# Patient Record
Sex: Female | Born: 1995
Health system: Southern US, Community
[De-identification: ages and names within clinical notes are randomized; demographics above are authoritative.]

## PROBLEM LIST (undated history)

## (undated) DIAGNOSIS — F909 Attention-deficit hyperactivity disorder, unspecified type: Secondary | ICD-10-CM

## (undated) DIAGNOSIS — T7840XA Allergy, unspecified, initial encounter: Secondary | ICD-10-CM

## (undated) DIAGNOSIS — F419 Anxiety disorder, unspecified: Secondary | ICD-10-CM

## (undated) DIAGNOSIS — F329 Major depressive disorder, single episode, unspecified: Secondary | ICD-10-CM

## (undated) DIAGNOSIS — F32A Depression, unspecified: Secondary | ICD-10-CM

## (undated) DIAGNOSIS — E669 Obesity, unspecified: Secondary | ICD-10-CM

## (undated) HISTORY — DX: Major depressive disorder, single episode, unspecified: F32.9

## (undated) HISTORY — DX: Depression, unspecified: F32.A

## (undated) HISTORY — PX: ANKLE SURGERY: SHX546

---
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
## 2016-02-02 ENCOUNTER — Emergency Department (HOSPITAL_COMMUNITY): Payer: Managed Care, Other (non HMO)

## 2016-02-02 ENCOUNTER — Emergency Department (HOSPITAL_COMMUNITY)
Admission: EM | Admit: 2016-02-02 | Discharge: 2016-02-02 | Disposition: A | Discharge status: Home / Self Care | Payer: Managed Care, Other (non HMO) | Attending: Emergency Medicine | Admitting: Emergency Medicine

## 2016-02-02 ENCOUNTER — Encounter (HOSPITAL_COMMUNITY): Payer: Self-pay | Admitting: Emergency Medicine

## 2016-02-02 DIAGNOSIS — Z79899 Other long term (current) drug therapy: Secondary | ICD-10-CM | POA: Diagnosis not present

## 2016-02-02 DIAGNOSIS — S82891A Other fracture of right lower leg, initial encounter for closed fracture: Secondary | ICD-10-CM

## 2016-02-02 DIAGNOSIS — W109XXA Fall (on) (from) unspecified stairs and steps, initial encounter: Secondary | ICD-10-CM | POA: Insufficient documentation

## 2016-02-02 DIAGNOSIS — S82831A Other fracture of upper and lower end of right fibula, initial encounter for closed fracture: Secondary | ICD-10-CM | POA: Insufficient documentation

## 2016-02-02 DIAGNOSIS — Y999 Unspecified external cause status: Secondary | ICD-10-CM | POA: Insufficient documentation

## 2016-02-02 DIAGNOSIS — S8262XA Displaced fracture of lateral malleolus of left fibula, initial encounter for closed fracture: Secondary | ICD-10-CM | POA: Diagnosis not present

## 2016-02-02 DIAGNOSIS — Y9389 Activity, other specified: Secondary | ICD-10-CM | POA: Diagnosis not present

## 2016-02-02 DIAGNOSIS — S8251XA Displaced fracture of medial malleolus of right tibia, initial encounter for closed fracture: Secondary | ICD-10-CM | POA: Diagnosis not present

## 2016-02-02 DIAGNOSIS — S99911A Unspecified injury of right ankle, initial encounter: Secondary | ICD-10-CM | POA: Diagnosis present

## 2016-02-02 DIAGNOSIS — Y9289 Other specified places as the place of occurrence of the external cause: Secondary | ICD-10-CM | POA: Insufficient documentation

## 2016-02-02 LAB — POC URINE PREG, ED: PREG TEST UR: NEGATIVE

## 2016-02-02 MED ORDER — OXYCODONE-ACETAMINOPHEN 5-325 MG PO TABS
2.0000 | ORAL_TABLET | Freq: Once | ORAL | Status: AC
  Administered 2016-02-02: 2 via ORAL
  Filled 2016-02-02: qty 2

## 2016-02-02 MED ORDER — OXYCODONE-ACETAMINOPHEN 5-325 MG PO TABS: 1.0000 | ORAL_TABLET | ORAL | Status: DC | PRN

## 2016-02-02 NOTE — ED Notes (Signed)
Pt. missed her step and injured her right ankle while going down the stairs this evening , presents with right ankle swelling/ pain .

## 2016-02-02 NOTE — ED Provider Notes (Addendum)
CSN: [id number]     Arrival date & time 02/02/16  0124 History  By signing my name below, I, Catherine Livingston, attest that this documentation has been prepared under the direction and in the presence of Laurence Spatesachel Morgan Little, MD. Electronically Signed: Bethel BornBritney Livingston, ED Scribe. 02/02/2016. 3:53 AM   Chief Complaint  Patient presents with  . Ankle Pain   The history is provided by the patient. No language interpreter was used.   Catherine Livingston is a 20 y.o. female who presents to the Emergency Department complaining of new, constant, 8/10 in severity, right ankle swelling and pain with onset last evening after she missed a step and rolled the ankle. Pt denies other injury including head injury or LOC. She has not injured the ankle in the past. Normal sensation R foot. Pt is otherwise healthy and does not take any regular pain medication. NKDA.   Past Medical History  Diagnosis Date  . ADHD (attention deficit hyperactivity disorder)     took meds for ADD in 7th-9th grade, stopped due to decrease appetitie  . Allergy   . Anxiety   . Obesity   . Depression    History reviewed. No pertinent past surgical history. Family History  Problem Relation Age of Onset  . Depression Mother   . Depression Father   . Lung cancer Maternal Grandmother   . Depression Maternal Grandfather   . Prostate cancer Paternal Grandfather   . Stroke Paternal Grandfather    Social History  Substance Use Topics  . Smoking status: Never Smoker   . Smokeless tobacco: Never Used  . Alcohol Use: 0.0 oz/week    0 Standard drinks or equivalent per week     Comment: occasional   OB History    No data available     Review of Systems  10 Systems reviewed and all are negative for acute change except as noted in the HPI.  Allergies  Other  Home Medications   Prior to Admission medications   Medication Sig Start Date End Date Taking? Authorizing Provider  FLUoxetine (PROZAC) 20 MG tablet Take 1 tablet (20  mg total) by mouth daily. Patient not taking: Reported on 02/02/2016 09/21/15   Lorre Munroeegina W Baity, NP  oxyCODONE-acetaminophen (PERCOCET) 5-325 MG tablet Take 1-2 tablets by mouth every 4 (four) hours as needed for severe pain. 02/02/16   Ambrose Finlandachel Morgan Little, MD   BP 141/88 mmHg  Pulse 72  Temp(Src) 98.3 F (36.8 C) (Oral)  Resp 16  Ht 5' 7.5" (1.715 m)  Wt 210 lb (95.255 kg)  BMI 32.39 kg/m2  SpO2 99%  LMP 01/19/2016 (Approximate) Physical Exam  Constitutional: She is oriented to person, place, and time. She appears well-developed and well-nourished. No distress.  Tearful but NAD  HENT:  Head: Normocephalic and atraumatic.  Eyes: Conjunctivae are normal.  Cardiovascular: Normal rate, regular rhythm, normal heart sounds and intact distal pulses.   Pulmonary/Chest: Effort normal and breath sounds normal. No respiratory distress.  Musculoskeletal: She exhibits edema and tenderness.  Right Achilles tendon intact, no proximal fibular tenderness, no base of 5th metatarsal tenderness, no midfoot instability; tenderness and swelling of medial and lateral right malleoli w/ surrounding ecchymoses; no knee tenderness  Neurological: She is alert and oriented to person, place, and time.  Normal sensation b/l lower extremities  Skin: Skin is warm and dry.  Ecchymoses medial and lateral right ankle  Psychiatric: Judgment normal.  anxious  Nursing note and vitals reviewed.   ED Course  Procedures (including critical care time) DIAGNOSTIC STUDIES: Oxygen Saturation is 99% on RA,  normal by my interpretation.    COORDINATION OF CARE:  3:09 AM-Consult complete with Dr. Sherlean Foot (Orthopedics). Patient case explained and discussed.  3:39 AM Discussed treatment plan which includes right ankle XR, splinting, and pain medication with pt at bedside and pt agreed to plan.   Labs Review Labs Reviewed  POC URINE PREG, ED    Imaging Review Dg Ankle Complete Right  02/02/2016  CLINICAL DATA:  Twisted  ankle on steps. Fall with pain. Initial encounter. EXAM: RIGHT ANKLE - COMPLETE 3+ VIEW COMPARISON:  None. FINDINGS: Distal fibular diaphysis fracture with butterfly fragment. The fracture is displaced ~50% laterally. Transverse medial malleolus fracture which is distracted and laterally displaced. Posterior malleolus fracture is likely. No measurable articular surface involvement. Lateral foot subluxation. Ankle joint effusion Chronic fragmentation at the tip of the lateral malleolus. Smooth talar dome. IMPRESSION: Displaced distal fibular and medial malleolus fractures with lateral foot subluxation. Small posterior malleolus fracture. Electronically Signed   By: Marnee Spring M.D.   On: 02/02/2016 02:20   I have personally reviewed and evaluated these images as part of my medical decision-making.   EKG Interpretation None     Medications  oxyCODONE-acetaminophen (PERCOCET/ROXICET) 5-325 MG per tablet 2 tablet (2 tablets Oral Given 02/02/16 0314)    MDM   Final diagnoses:  Closed right ankle fracture, initial encounter   Patient with right ankle pain after falling down stairs onto her ankle. She was neurovascularly intact, significant swelling, tenderness, and ecchymoses of right medial and lateral malleoli. X-rays show displaced distal fibular and medial malleolus fractures with lateral foot subluxation, small posterior malleolus fracture. I discussed the patient's injury with orthopedics, Dr. Sherlean Foot, who recommended splint, NWB w/ crutches, pain control, and f/u in his clinic today for discussion of surgical management. I reviewed this plan with the patient who is in agreement. Gave the patient Percocet for pain and provided with prescription. She voiced understanding of return precautions and was discharged in satisfactory condition.   I personally performed the services described in this documentation, which was scribed in my presence. The recorded information has been reviewed and is  accurate.    Laurence Spates, MD 02/02/16 0401  Laurence Spates, MD 02/02/16 [phone]

## 2016-05-25 ENCOUNTER — Other Ambulatory Visit: Payer: Self-pay

## 2016-05-29 ENCOUNTER — Other Ambulatory Visit: Payer: Self-pay

## 2017-02-05 ENCOUNTER — Telehealth: Payer: Self-pay | Admitting: Internal Medicine

## 2017-02-05 NOTE — Telephone Encounter (Signed)
[facility] - Client TELEPHONE ADVICE RECORD [facility] Patient Name: Catherine OtterMARY Eversley DOB: [birth year] Initial Comment daughter abdominal cramps, blood in stool Nurse Assessment Nurse: Lane HackerHarley, RN, Elvin SoWindy Date/Time (Eastern Time): 02/05/2017 2:07:38 PM Confirm and document reason for call. If symptomatic, describe symptoms. ---Caller states that daughter is c/o lower abdominal cramps that started this AM, and blood in stool noticed last night. Does the patient have any new or worsening symptoms? ---Yes Will a triage be completed? ---Yes Related visit to physician within the last 2 weeks? ---No Does the PT have any chronic conditions? (i.e. diabetes, asthma, etc.) ---No Is the patient pregnant or possibly pregnant? (Ask all females between the ages of 3712-55) ---No Is this a behavioral health or substance abuse call? ---No Guidelines Guideline Title Affirmed Question Affirmed Notes Rectal Bleeding MODERATE rectal bleeding (small blood clots, passing blood without stool, or toilet water turns red) Final Disposition User See Physician within [address]24 Hours Del[place]ley, [place], [place] Comments Watery diarrhea once in last 24 hrs. Appt with Nicki Reaperegina Baity NP for tomorrow, 02/06/17 at 2 pm. Referrals REFERRED TO PCP OFFICE Disagree/Comply: Comply

## 2017-02-05 NOTE — Telephone Encounter (Signed)
If worse overnight, to ER

## 2017-02-06 ENCOUNTER — Encounter: Payer: Self-pay | Admitting: Internal Medicine

## 2017-02-06 ENCOUNTER — Ambulatory Visit (INDEPENDENT_AMBULATORY_CARE_PROVIDER_SITE_OTHER): Payer: Managed Care, Other (non HMO) | Admitting: Internal Medicine

## 2017-02-06 VITALS — BP 120/78 | HR 56 | Temp 98.4°F | Wt 219.0 lb

## 2017-02-06 DIAGNOSIS — K625 Hemorrhage of anus and rectum: Secondary | ICD-10-CM | POA: Diagnosis not present

## 2017-02-06 MED ORDER — HYDROCORTISONE ACETATE 25 MG RE SUPP: 25.0000 mg | Freq: Two times a day (BID) | RECTAL | 0 refills | Status: DC

## 2017-02-06 NOTE — Progress Notes (Signed)
Subjective:    Patient ID: Catherine Livingston, female    DOB: 05/27/1996, 21 y.o.   MRN: [id number]  HPI  Pt presents to the clinic today with c/o abdominal cramping and blood in her stool. She reports this occured 2 days ago. She reports the blood was in the toilet, not in the stool. She also noticed blood when she wiped this morning. She denies nausea, vomiting, constipation or diarrhea. She reports she normally has a BM daily. She denise rectal pain or itching. She has not tried anything OTC for this.  Review of Systems      Past Medical History:  Diagnosis Date  . ADHD (attention deficit hyperactivity disorder)    took meds for ADD in 7th-9th grade, stopped due to decrease appetitie  . Allergy   . Anxiety   . Depression   . Obesity     Current Outpatient Prescriptions  Medication Sig Dispense Refill  . FLUoxetine (PROZAC) 20 MG tablet Take 1 tablet (20 mg total) by mouth daily. (Patient not taking: Reported on 02/02/2016) 30 tablet 3  . oxyCODONE-acetaminophen (PERCOCET) 5-325 MG tablet Take 1-2 tablets by mouth every 4 (four) hours as needed for severe pain. 20 tablet 0   No current facility-administered medications for this visit.     Allergies  Allergen Reactions  . Other Other (See Comments)    Seasonal allergies causes runny nose    Family History  Problem Relation Age of Onset  . Depression Mother   . Depression Father   . Lung cancer Maternal Grandmother   . Depression Maternal Grandfather   . Prostate cancer Paternal Grandfather   . Stroke Paternal Grandfather     Social History   Social History  . Marital status: Single    Spouse name: N/A  . Number of children: N/A  . Years of education: N/A   Occupational History  . Not on file.   Social History Main Topics  . Smoking status: Never Smoker  . Smokeless tobacco: Never Used  . Alcohol use 0.0 oz/week     Comment: occasional  . Drug use: Yes    Frequency: 4.0 times per week    Types: Other-see  comments, Marijuana  . Sexual activity: No   Other Topics Concern  . Not on file   Social History Narrative  . No narrative on file     Constitutional: Denies fever, malaise, fatigue, headache or abrupt weight changes.  Gastrointestinal: Pt reports abdominal cramping and blood in her stool. Denies bloating, constipation, diarrhea.   No other specific complaints in a complete review of systems (except as listed in HPI above).  Objective:   Physical Exam  BP 120/78   Pulse (!) 56   Temp 98.4 F (36.9 C) (Oral)   Wt 219 lb (99.3 kg)   LMP 12/18/2016 Comment: irregular  SpO2 98%   BMI 33.79 kg/m  Wt Readings from Last 3 Encounters:  02/06/17 219 lb (99.3 kg)  02/02/16 210 lb (95.3 kg) (98 %, Z= 2.08)*  09/21/15 221 lb (100.2 kg) (99 %, Z= 2.21)*   * Growth percentiles are based on CDC 2-20 Years data.    General: Appears her stated age, obese in NAD. Abdomen: Soft and nontender. Hypoactive bowel sounds. No distention or masses noted.  Rectal: No external hemorrhoid noted. Normal rectal tone. No fissures noted. Hemoccult positive.  BMET    Component Value Date/Time   NA 139 09/21/2015 1358   K 4.2 09/21/2015 1358  CL 106 09/21/2015 1358   CO2 25 09/21/2015 1358   GLUCOSE 103 (H) 09/21/2015 1358   BUN 10 09/21/2015 1358   CREATININE 0.77 09/21/2015 1358   CALCIUM 9.3 09/21/2015 1358   GFRNONAA NOT CALCULATED 10/21/2013 0555   GFRAA NOT CALCULATED 10/21/2013 0555    Lipid Panel     Component Value Date/Time   CHOL 153 09/21/2015 1358   TRIG 112.0 09/21/2015 1358   HDL 36.10 (L) 09/21/2015 1358   CHOLHDL 4 09/21/2015 1358   VLDL 22.4 09/21/2015 1358   LDLCALC 95 09/21/2015 1358    CBC    Component Value Date/Time   WBC 7.1 09/21/2015 1358   RBC 5.13 09/21/2015 1358   HGB 15.0 09/21/2015 1358   HCT 44.3 09/21/2015 1358   PLT 235.0 09/21/2015 1358   MCV 86.3 09/21/2015 1358   MCH 28.6 10/21/2013 0555   MCHC 33.8 09/21/2015 1358   RDW 13.1 09/21/2015  1358    Hgb A1C Lab Results  Component Value Date   HGBA1C 5.9 09/21/2015            Assessment & Plan:   Blood per Rectum:  ? Internal hemorrhoid Avoid straining, drink plenty of fluids Will treat with Anusol HC BID x 5 days If persist or worsens, may need GI referral  Return precautions discussed Nicki ReaperBAITY, Thelia Tanksley, NP

## 2017-02-06 NOTE — Patient Instructions (Signed)
Hemorrhoids    Hemorrhoids are swollen veins in and around the rectum or anus. Hemorrhoids can cause pain, itching, or bleeding. Most of the time, they do not cause serious problems. They usually get better with diet changes, lifestyle changes, and other home treatments.  Follow these instructions at home:  Eating and drinking  · Eat foods that have fiber, such as whole grains, beans, nuts, fruits, and vegetables. Ask your doctor about taking products that have added fiber (fiber supplements).  · Drink enough fluid to keep your pee (urine) clear or pale yellow.  For Pain and Swelling  · Take a warm-water bath (sitz bath) for 20 minutes to ease pain. Do this 3-4 times a day.  · If directed, put ice on the painful area. It may be helpful to use ice between your warm baths.  ¨ Put ice in a plastic bag.  ¨ Place a towel between your skin and the bag.  ¨ Leave the ice on for 20 minutes, 2-3 times a day.  General instructions  · Take over-the-counter and prescription medicines only as told by your doctor.  ¨ Medicated creams and medicines that are inserted into the anus (suppositories) may be used or applied as told.  · Exercise often.  · Go to the bathroom when you have the urge to poop (to have a bowel movement). Do not wait.  · Avoid pushing too hard (straining) when you poop.  · Keep the butt area dry and clean. Use wet toilet paper or moist paper towels.  · Do not sit on the toilet for a long time.  Contact a doctor if:  · You have any of these:  ¨ Pain and swelling that do not get better with treatment or medicine.  ¨ Bleeding that will not stop.  ¨ Trouble pooping or you cannot poop.  ¨ Pain or swelling outside the area of the hemorrhoids.  This information is not intended to replace advice given to you by your health care provider. Make sure you discuss any questions you have with your health care provider.  Document Released: 04/11/2008 Document Revised: 12/09/2015 Document Reviewed: 03/17/2015  Elsevier  Interactive Patient Education © 2018 [employer] Inc.   

## 2017-02-18 ENCOUNTER — Other Ambulatory Visit: Payer: Self-pay | Admitting: Internal Medicine

## 2017-02-19 NOTE — Telephone Encounter (Signed)
Last filled 09/2015 with 3 refills... Please advise

## 2017-02-22 NOTE — Telephone Encounter (Signed)
Will give 30 day supply. Needs annual exam

## 2017-02-23 NOTE — Telephone Encounter (Signed)
Letter mailed

## 2017-03-23 ENCOUNTER — Other Ambulatory Visit: Payer: Self-pay | Admitting: Internal Medicine

## 2017-04-21 ENCOUNTER — Other Ambulatory Visit: Payer: Self-pay | Admitting: Internal Medicine

## 2017-04-23 NOTE — Telephone Encounter (Signed)
Last refill 03/23/17 #30 Note sent to patient to schedule annual physical, no appointment scheduled Last office visit 02/06/17

## 2017-07-05 ENCOUNTER — Ambulatory Visit (INDEPENDENT_AMBULATORY_CARE_PROVIDER_SITE_OTHER): Payer: Managed Care, Other (non HMO) | Admitting: Internal Medicine

## 2017-07-05 ENCOUNTER — Encounter: Payer: Self-pay | Admitting: Internal Medicine

## 2017-07-05 VITALS — BP 124/84 | HR 68 | Temp 98.4°F | Wt 215.0 lb

## 2017-07-05 DIAGNOSIS — F640 Transsexualism: Secondary | ICD-10-CM

## 2017-07-05 DIAGNOSIS — R1013 Epigastric pain: Secondary | ICD-10-CM

## 2017-07-05 DIAGNOSIS — J3089 Other allergic rhinitis: Secondary | ICD-10-CM

## 2017-07-05 DIAGNOSIS — K219 Gastro-esophageal reflux disease without esophagitis: Secondary | ICD-10-CM

## 2017-07-05 NOTE — Progress Notes (Signed)
Subjective:    Patient ID: Catherine Livingston, female    DOB: Aug 07, 1995, 21 y.o.   MRN: [id number]  HPI  Pt presents to the clinic today with multiple complaints.  1- She reports runny nose, nasal congestion, ear fullness and cough. This started 1-2 months ago. She is blowing clear mucous out of her nose. She denies ear pain or decreased hearing. The cough is productive of green mucous at times. She denies fever, chills or body aches. She has tried Flonase and Mucinex with minimal relief. She has not had sick contacts that she is aware of.   2- She also reports epigastric pain. This started 2 days ago. She describes the pain as a burning sensation with some mild reflux. Her symptoms are worse when she lays down at night. She denies nausea, vomiting, diarrhea or constipation. She denies chest pain or shortness of breath. She tried Tums one time with minimal relief. She denies changes in diet or medication.  3- She wants to know the process for starting on Testosterone therapy for gender transition. She is currently following with [facility].  Review of Systems      Past Medical History:  Diagnosis Date  . ADHD (attention deficit hyperactivity disorder)    took meds for ADD in 7th-9th grade, stopped due to decrease appetitie  . Allergy   . Anxiety   . Depression   . Obesity     No current outpatient medications on file.   No current facility-administered medications for this visit.     Allergies  Allergen Reactions  . Other Other (See Comments)    Seasonal allergies causes runny nose    Family History  Problem Relation Age of Onset  . Depression Mother   . Depression Father   . Lung cancer Maternal Grandmother   . Depression Maternal Grandfather   . Prostate cancer Paternal Grandfather   . Stroke Paternal Grandfather     Social History   Socioeconomic History  . Marital status: Single    Spouse name: Not on file  . Number of children: Not on file  . Years of  education: Not on file  . Highest education level: Not on file  Social Needs  . Financial resource strain: Not on file  . Food insecurity - worry: Not on file  . Food insecurity - inability: Not on file  . Transportation needs - medical: Not on file  . Transportation needs - non-medical: Not on file  Occupational History  . Not on file  Tobacco Use  . Smoking status: Never Smoker  . Smokeless tobacco: Never Used  Substance and Sexual Activity  . Alcohol use: Yes    Alcohol/week: 0.0 oz    Comment: occasional  . Drug use: Yes    Frequency: 4.0 times per week    Types: Other-see comments, Marijuana  . Sexual activity: No  Other Topics Concern  . Not on file  Social History Narrative  . Not on file     Constitutional: Denies fever, malaise, fatigue, headache or abrupt weight changes.  HEENT: Pt reports runny nose, nasal congestion, and ear fullness. Denies eye pain, eye redness, ear pain, ringing in the ears, wax buildup, bloody nose, or sore throat. Respiratory: Pt reports cough. Denies difficulty breathing, shortness of breath.   Cardiovascular: Denies chest pain, chest tightness, palpitations or swelling in the hands or feet.  Gastrointestinal: Pt reports epigastric pain. Denies bloating, constipation, diarrhea or blood in the stool.  GU: Pt  reports vaginal bleeding (currently on menses). Denies urgency, frequency, pain with urination, burning sensation, blood in urine, odor or discharge.  No other specific complaints in a complete review of systems (except as listed in HPI above).  Objective:   Physical Exam   BP 124/84   Pulse 68   Temp 98.4 F (36.9 C) (Oral)   Wt 215 lb (97.5 kg)   LMP 07/05/2017   SpO2 97%   BMI 33.18 kg/m  Wt Readings from Last 3 Encounters:  07/05/17 215 lb (97.5 kg)  02/06/17 219 lb (99.3 kg)  02/02/16 210 lb (95.3 kg) (98 %, Z= 2.08)*   * Growth percentiles are based on CDC (Girls, 2-20 Years) data.    General: Appears herstated age,  obese in NAD. HEENT: Head: normal shape and size, no sinus tenderness noted; Ears: Tm's gray and intact, normal light reflex; Nose: mucosa boggy and moist, turbinates swollen; Throat/Mouth: Teeth present, mucosa pink and moist, + PND, no exudate, lesions or ulcerations noted.  Neck:  No adenopathy noted. Cardiovascular: Normal rate and rhythm.  Pulmonary/Chest: Normal effort and positive vesicular breath sounds. No respiratory distress. No wheezes, rales or ronchi noted.  Abdomen: Soft and mildly tender in the epigastric region. Normal bowel sounds. No distention or masses noted.    BMET    Component Value Date/Time   NA 139 09/21/2015 1358   K 4.2 09/21/2015 1358   CL 106 09/21/2015 1358   CO2 25 09/21/2015 1358   GLUCOSE 103 (H) 09/21/2015 1358   BUN 10 09/21/2015 1358   CREATININE 0.77 09/21/2015 1358   CALCIUM 9.3 09/21/2015 1358   GFRNONAA NOT CALCULATED 10/21/2013 0555   GFRAA NOT CALCULATED 10/21/2013 0555    Lipid Panel     Component Value Date/Time   CHOL 153 09/21/2015 1358   TRIG 112.0 09/21/2015 1358   HDL 36.10 (L) 09/21/2015 1358   CHOLHDL 4 09/21/2015 1358   VLDL 22.4 09/21/2015 1358   LDLCALC 95 09/21/2015 1358    CBC    Component Value Date/Time   WBC 7.1 09/21/2015 1358   RBC 5.13 09/21/2015 1358   HGB 15.0 09/21/2015 1358   HCT 44.3 09/21/2015 1358   PLT 235.0 09/21/2015 1358   MCV 86.3 09/21/2015 1358   MCH 28.6 10/21/2013 0555   MCHC 33.8 09/21/2015 1358   RDW 13.1 09/21/2015 1358    Hgb A1C Lab Results  Component Value Date   HGBA1C 5.9 09/21/2015           Assessment & Plan:   Allergic Rhinitis:  Advised her to use Zyrtec instead of Mucinex Continue Flonase  Epigastric Pain, GERD:  Advised to avoid laying down for at least 2 hours after eating Discussed common foods that cause GERD Discussed how weight loss could help improve symptoms  Desire for Gender Transition:  Advised her this is not something that I can help her  with, due to my lack of expertise She will continue to follow with the [facility]  Return precautions discussed Nicki ReaperBAITY, REGINA, NP

## 2017-07-06 ENCOUNTER — Encounter: Payer: Self-pay | Admitting: Internal Medicine

## 2017-07-08 NOTE — Patient Instructions (Signed)

## 2017-08-20 ENCOUNTER — Encounter: Payer: Managed Care, Other (non HMO) | Admitting: Internal Medicine

## 2017-08-27 ENCOUNTER — Encounter: Payer: Self-pay | Admitting: Internal Medicine

## 2017-08-27 ENCOUNTER — Ambulatory Visit (INDEPENDENT_AMBULATORY_CARE_PROVIDER_SITE_OTHER): Payer: Managed Care, Other (non HMO) | Admitting: Internal Medicine

## 2017-08-27 VITALS — BP 100/64 | HR 70 | Temp 98.6°F | Resp 16 | Ht 66.5 in | Wt 214.5 lb

## 2017-08-27 DIAGNOSIS — Z23 Encounter for immunization: Secondary | ICD-10-CM

## 2017-08-27 DIAGNOSIS — Z0000 Encounter for general adult medical examination without abnormal findings: Secondary | ICD-10-CM

## 2017-08-27 NOTE — Progress Notes (Signed)
Subjective:    Patient ID: Catherine Livingston, female    DOB: 1995/12/12, 21 y.o.   MRN: [id number]  HPI  Catherine Livingston presents to the clinic today for her annual exam.   Flu: never Tetanus: 2009 Dentist: biannually  Diet: She does eat meat. She consumes more veggies than fruit. She has tried to avoid fried food. She drinks mostly sweet tea, Gatorade, and water Exercise: None  Review of Systems      Past Medical History:  Diagnosis Date  . ADHD (attention deficit hyperactivity disorder)    took meds for ADD in 7th-9th grade, stopped due to decrease appetitie  . Allergy   . Anxiety   . Depression   . Obesity     No current outpatient medications on file.   No current facility-administered medications for this visit.     Allergies  Allergen Reactions  . Other Other (See Comments)    Seasonal allergies causes runny nose    Family History  Problem Relation Age of Onset  . Depression Mother   . Depression Father   . Lung cancer Maternal Grandmother   . Depression Maternal Grandfather   . Prostate cancer Paternal Grandfather   . Stroke Paternal Grandfather     Social History   Socioeconomic History  . Marital status: Single    Spouse name: Not on file  . Number of children: Not on file  . Years of education: Not on file  . Highest education level: Not on file  Social Needs  . Financial resource strain: Not on file  . Food insecurity - worry: Not on file  . Food insecurity - inability: Not on file  . Transportation needs - medical: Not on file  . Transportation needs - non-medical: Not on file  Occupational History  . Not on file  Tobacco Use  . Smoking status: Never Smoker  . Smokeless tobacco: Never Used  Substance and Sexual Activity  . Alcohol use: Yes    Alcohol/week: 0.0 oz    Comment: occasional  . Drug use: Yes    Frequency: 4.0 times per week    Types: Other-see comments, Marijuana  . Sexual activity: No  Other Topics Concern  . Not on file    Social History Narrative  . Not on file     Constitutional: Denies fever, malaise, fatigue, headache or abrupt weight changes.  HEENT: Denies eye pain, eye redness, ear pain, ringing in the ears, wax buildup, runny nose, nasal congestion, bloody nose, or sore throat. Respiratory: Denies difficulty breathing, shortness of breath, cough or sputum production.   Cardiovascular: Denies chest pain, chest tightness, palpitations or swelling in the hands or feet.  Gastrointestinal: Denies abdominal pain, bloating, constipation, diarrhea or blood in the stool.  GU: Catherine Livingston reports irregular periods. Denies urgency, frequency, pain with urination, burning sensation, blood in urine, odor or discharge. Musculoskeletal: Denies decrease in range of motion, difficulty with gait, muscle pain or joint pain and swelling.  Skin: Denies redness, rashes, lesions or ulcercations.  Neurological: Denies dizziness, difficulty with memory, difficulty with speech or problems with balance and coordination.  Psych: Denies anxiety, depression, SI/HI.  No other specific complaints in a complete review of systems (except as listed in HPI above).   Objective:   Physical Exam  BP 100/64 (BP Location: Left Arm, Patient Position: Sitting, Cuff Size: Large)   Pulse 70   Temp 98.6 F (37 C) (Oral)   Resp 16   Ht 5' 6.5" (1.689 m)  Wt 214 lb 8 oz (97.3 kg)   LMP  (LMP Unknown)   SpO2 98%   BMI 34.10 kg/m  Wt Readings from Last 3 Encounters:  08/27/17 214 lb 8 oz (97.3 kg)  07/05/17 215 lb (97.5 kg)  02/06/17 219 lb (99.3 kg)    General: Appears her stated age, obese in NAD. Skin: Warm, dry and intact. Hirsutism noted on face and bilateral forearms. HEENT: Head: normal shape and size; Eyes: sclera white, no icterus, conjunctiva pink, PERRLA and EOMs intact; Ears: Tm's gray and intact, normal light reflex;Throat/Mouth: Teeth present, mucosa pink and moist, no exudate, lesions or ulcerations noted.  Neck:  Neck  supple, trachea midline. No masses, lumps or thyromegaly present.  Cardiovascular: Normal rate and rhythm. S1,S2 noted.  No murmur, rubs or gallops noted. No JVD or BLE edema.  Pulmonary/Chest: Normal effort and positive vesicular breath sounds. No respiratory distress. No wheezes, rales or ronchi noted.  Abdomen: Soft and nontender. Normal bowel sounds. No distention or masses noted. Liver, spleen and kidneys non palpable. Musculoskeletal: Strength 5/5 BEU/BLE. No difficulty with gait.  Neurological: Alert and oriented. Cranial nerves II-XII grossly intact. Coordination normal.  Psychiatric: Mood and affect normal. Behavior is normal. Judgment and thought content normal.    BMET    Component Value Date/Time   NA 139 09/21/2015 1358   K 4.2 09/21/2015 1358   CL 106 09/21/2015 1358   CO2 25 09/21/2015 1358   GLUCOSE 103 (H) 09/21/2015 1358   BUN 10 09/21/2015 1358   CREATININE 0.77 09/21/2015 1358   CALCIUM 9.3 09/21/2015 1358   GFRNONAA NOT CALCULATED 10/21/2013 0555   GFRAA NOT CALCULATED 10/21/2013 0555    Lipid Panel     Component Value Date/Time   CHOL 153 09/21/2015 1358   TRIG 112.0 09/21/2015 1358   HDL 36.10 (L) 09/21/2015 1358   CHOLHDL 4 09/21/2015 1358   VLDL 22.4 09/21/2015 1358   LDLCALC 95 09/21/2015 1358    CBC    Component Value Date/Time   WBC 7.1 09/21/2015 1358   RBC 5.13 09/21/2015 1358   HGB 15.0 09/21/2015 1358   HCT 44.3 09/21/2015 1358   PLT 235.0 09/21/2015 1358   MCV 86.3 09/21/2015 1358   MCH 28.6 10/21/2013 0555   MCHC 33.8 09/21/2015 1358   RDW 13.1 09/21/2015 1358    Hgb A1C Lab Results  Component Value Date   HGBA1C 5.9 09/21/2015           Assessment & Plan:   Preventative Health Maintenance:  She declines flu shot Td today Encouraged her to consume a balanced diet and exercise regimen Advised her to see a dentist annually Will check CBC, CMET, Lipid and A1C today  RTC in 1 year, sooner if needed Nicki Reaper,  NP

## 2017-08-27 NOTE — Addendum Note (Signed)
Addended by: Smitty KnudsenSUTHERLAND, Anne Boltz K on: 08/27/2017 03:36 PM   Modules accepted: Orders

## 2017-08-27 NOTE — Patient Instructions (Signed)

## 2017-08-28 LAB — COMPREHENSIVE METABOLIC PANEL
ALK PHOS: 49 U/L (ref 39–117)
ALT: 45 U/L — ABNORMAL HIGH (ref 0–35)
AST: 26 U/L (ref 0–37)
Albumin: 4.4 g/dL (ref 3.5–5.2)
BUN: 12 mg/dL (ref 6–23)
CHLORIDE: 104 meq/L (ref 96–112)
CO2: 25 mEq/L (ref 19–32)
Calcium: 9.2 mg/dL (ref 8.4–10.5)
Creatinine, Ser: 0.83 mg/dL (ref 0.40–1.20)
GFR: 92.27 mL/min (ref >60.00)
Glucose, Bld: 84 mg/dL (ref 70–99)
POTASSIUM: 4.2 meq/L (ref 3.5–5.1)
Sodium: 137 mEq/L (ref 135–145)
TOTAL PROTEIN: 7 g/dL (ref 6.0–8.3)
Total Bilirubin: 0.6 mg/dL (ref 0.2–1.2)

## 2017-08-28 LAB — CBC
HEMATOCRIT: 47.6 % — AB (ref 36.0–46.0)
HEMOGLOBIN: 15.7 g/dL — AB (ref 12.0–15.0)
MCHC: 33 g/dL (ref 30.0–36.0)
MCV: 90.6 fl (ref 78.0–100.0)
Platelets: 268 10*3/uL (ref 150.0–400.0)
RBC: 5.26 Mil/uL — ABNORMAL HIGH (ref 3.87–5.11)
RDW: 13 % (ref 11.5–14.6)
WBC: 13.7 10*3/uL — ABNORMAL HIGH (ref 4.5–10.5)

## 2017-08-28 LAB — LIPID PANEL
Cholesterol: 157 mg/dL (ref 0–200)
HDL: 36.4 mg/dL — AB (ref >39.00)
LDL Cholesterol: 97 mg/dL (ref 0–99)
NONHDL: 120.49
Total CHOL/HDL Ratio: 4
Triglycerides: 116 mg/dL (ref 0.0–149.0)
VLDL: 23.2 mg/dL (ref 0.0–40.0)

## 2017-08-28 LAB — HEMOGLOBIN A1C: Hgb A1c MFr Bld: 5.9 % (ref 4.6–6.5)

## 2017-09-03 ENCOUNTER — Telehealth: Payer: Self-pay

## 2017-09-03 NOTE — Telephone Encounter (Signed)
PLEASE NOTE: All timestamps contained within this report are represented as [place] Standard Time. CONFIDENTIALTY NOTICE: This fax transmission is intended only for the addressee. It contains information that is legally privileged, confidential or otherwise protected from use or disclosure. If you are not the intended recipient, you are strictly prohibited from reviewing, disclosing, copying using or disseminating any of this information or taking any action in reliance on or regarding this information. If you have received this fax in error, please notify us immediately by telephone so that we can arrange for its return to us. Phone: [phone], Toll-Free: [phone], Fax: [phone] Page: 1 of 1 Call Id: [id number] [facility] - Client Nonclinical Telephone Record [facility] Client [facility] - [facility] - Night Physician Nicki ReaperBaity, Regina - NP Contact Type Call Who Is Calling Patient / Member / Family / Caregiver Caller Name Catherine OtterMary Livingston Caller Phone Number [phone] Call Type Message Only Information Provided Reason for Call Returning a Call from the Office Initial Comment Caller is returning a call. Declined triage. Additional Comment Call Closed By: Ardelle AntonLisa Owen Transaction Date/Time: 08/31/2017 6:41:10 PM (ET)

## 2018-03-15 ENCOUNTER — Ambulatory Visit: Payer: Managed Care, Other (non HMO) | Admitting: Family Medicine

## 2018-03-15 ENCOUNTER — Encounter: Payer: Self-pay | Admitting: Family Medicine

## 2018-03-15 VITALS — BP 110/60 | HR 89 | Temp 98.6°F | Ht 66.5 in | Wt 216.0 lb

## 2018-03-15 DIAGNOSIS — R112 Nausea with vomiting, unspecified: Secondary | ICD-10-CM | POA: Diagnosis not present

## 2018-03-15 DIAGNOSIS — R21 Rash and other nonspecific skin eruption: Secondary | ICD-10-CM | POA: Diagnosis not present

## 2018-03-15 LAB — COMPREHENSIVE METABOLIC PANEL
ALK PHOS: 51 U/L (ref 39–117)
ALT: 63 U/L — ABNORMAL HIGH (ref 0–35)
AST: 32 U/L (ref 0–37)
Albumin: 4.8 g/dL (ref 3.5–5.2)
BILIRUBIN TOTAL: 0.8 mg/dL (ref 0.2–1.2)
BUN: 10 mg/dL (ref 6–23)
CO2: 29 mEq/L (ref 19–32)
Calcium: 9.8 mg/dL (ref 8.4–10.5)
Chloride: 100 mEq/L (ref 96–112)
Creatinine, Ser: 0.87 mg/dL (ref 0.40–1.20)
GFR: 86.94 mL/min (ref >60.00)
GLUCOSE: 96 mg/dL (ref 70–99)
POTASSIUM: 4.2 meq/L (ref 3.5–5.1)
Sodium: 136 mEq/L (ref 135–145)
TOTAL PROTEIN: 7.7 g/dL (ref 6.0–8.3)

## 2018-03-15 LAB — CBC WITH DIFFERENTIAL/PLATELET
Basophils Absolute: 0 10*3/uL (ref 0.0–0.1)
Basophils Relative: 0.3 % (ref 0.0–3.0)
EOS PCT: 1.3 % (ref 0.0–5.0)
Eosinophils Absolute: 0.1 10*3/uL (ref 0.0–0.7)
HCT: 49.1 % — ABNORMAL HIGH (ref 36.0–46.0)
Hemoglobin: 16.5 g/dL — ABNORMAL HIGH (ref 12.0–15.0)
LYMPHS ABS: 2.3 10*3/uL (ref 0.7–4.0)
Lymphocytes Relative: 26.3 % (ref 12.0–46.0)
MCHC: 33.7 g/dL (ref 30.0–36.0)
MCV: 88.7 fl (ref 78.0–100.0)
Monocytes Absolute: 0.7 10*3/uL (ref 0.1–1.0)
Monocytes Relative: 7.6 % (ref 3.0–12.0)
NEUTROS PCT: 64.5 % (ref 43.0–77.0)
Neutro Abs: 5.7 10*3/uL (ref 1.4–7.7)
Platelets: 287 10*3/uL (ref 150.0–400.0)
RBC: 5.54 Mil/uL — ABNORMAL HIGH (ref 3.87–5.11)
RDW: 13.1 % (ref 11.5–15.5)
WBC: 8.9 10*3/uL (ref 4.0–10.5)

## 2018-03-15 MED ORDER — PROMETHAZINE HCL 25 MG/ML IJ SOLN
25.0000 mg | Freq: Once | INTRAMUSCULAR | Status: AC
  Administered 2018-03-15: 25 mg via INTRAMUSCULAR

## 2018-03-15 MED ORDER — METHYLPREDNISOLONE ACETATE 40 MG/ML IJ SUSP
40.0000 mg | Freq: Once | INTRAMUSCULAR | Status: AC
  Administered 2018-03-15: 40 mg via INTRAMUSCULAR

## 2018-03-15 NOTE — Progress Notes (Signed)
   Subjective:    Patient ID: Catherine Livingston, female    DOB: [birth year], 22 y.o.   MRN: [id number]  HPI   22 year old female pt presents for new onset dry itchy red  Starting t 7 days ago. Very mild rash  Tried benadry.. Cleared u p some.  Wednesday went fishing ( has been allergic to fish in past.  Wed night applied vit C oil to face for dry skin.  Then started with emesis and headache in last 24 hours.  Last night new rash appears like bruised face in area where previosu rsash was.  no longer itchy, not tender, but face achy.  no blisters, no pustule.  Low grade temp but only 99.0  bilateral temporal headache.  no joint pain, no neck stiffness  no tick bites   No sick contact  No new exposures  no diarrhea, mild constipation. No abd pain, gurgly prior to emesis  No past history of skin issues No new meds except benadryl   Blood pressure 110/60, pulse 89, temperature 98.6 F (37 C), temperature source Oral, height 5' 6.5" (1.689 m), weight 216 lb (98 kg). Social History /Family History/Past Medical History reviewed in detail and updated in EMR if needed.   Review of Systems  Constitutional: Negative for fatigue.  HENT: Negative for ear pain.   Eyes: Negative for pain.  Respiratory: Negative for shortness of breath.   Cardiovascular: Negative for chest pain.  Gastrointestinal: Positive for vomiting.       Objective:   Physical Exam  Constitutional: Vital signs are normal. She appears well-developed and well-nourished. She is cooperative.  Non-toxic appearance. She does not appear ill. No distress.  HENT:  Head: Normocephalic.  Right Ear: Hearing, tympanic membrane, external ear and ear canal normal. Tympanic membrane is not erythematous, not retracted and not bulging.  Left Ear: Hearing, tympanic membrane, external ear and ear canal normal. Tympanic membrane is not erythematous, not retracted and not bulging.  Nose: No mucosal edema or rhinorrhea. Right sinus  exhibits no maxillary sinus tenderness and no frontal sinus tenderness. Left sinus exhibits no maxillary sinus tenderness and no frontal sinus tenderness.  Mouth/Throat: Uvula is midline, oropharynx is clear and moist and mucous membranes are normal.  Eyes: Pupils are equal, round, and reactive to light. Conjunctivae, EOM and lids are normal. Lids are everted and swept, no foreign bodies found.  Neck: Trachea normal and normal range of motion. Neck supple. Carotid bruit is not present. No thyroid mass and no thyromegaly present.  Cardiovascular: Normal rate, regular rhythm, S1 normal, S2 normal, normal heart sounds, intact distal pulses and normal pulses. Exam reveals no gallop and no friction rub.  No murmur heard. Pulmonary/Chest: Effort normal and breath sounds normal. No tachypnea. No respiratory distress. She has no decreased breath sounds. She has no wheezes. She has no rhonchi. She has no rales.  Abdominal: Soft. Normal appearance and bowel sounds are normal. There is no tenderness.  Neurological: She is alert.  Skin: Skin is warm, dry and intact. No rash noted.  Non-blanching purpuric rash over face with very sharp demarcation on left face that appears like contact of allergen  Small amount of old bruising on left anterior neck  Psychiatric: Her speech is normal and behavior is normal. Judgment and thought content normal. Her mood appears not anxious. Cognition and memory are normal. She does not exhibit a depressed mood.          Assessment & Plan:

## 2018-03-15 NOTE — Assessment & Plan Note (Signed)
Unusual rash.. Given sharp demarcations.. Appear most consistent with contact reaction to allergen... To vit C oil applied  Or something with fishing gear. Treat with pred and phenergan. Eval with labs given vascular nature.  No fever, no neuro change, no red flags. No current respiratory compromise.  Reviewed ER precautions.

## 2018-03-15 NOTE — Patient Instructions (Addendum)
Gradually increase fluids as tolerated .  Please stop at the lab to have labs drawn.

## 2018-03-29 ENCOUNTER — Telehealth: Payer: Self-pay | Admitting: Family Medicine

## 2018-03-29 NOTE — Telephone Encounter (Signed)
Called and spoke with patient. I advised her of Dr. Clelia CroftShaw being on leave for September and October. I was able to reschedule her for 11/13 at 11:20. I advised her of time, building and late policy.

## 2018-04-16 ENCOUNTER — Telehealth: Payer: Self-pay | Admitting: Family Medicine

## 2018-04-16 NOTE — Telephone Encounter (Signed)
Called and spoke with pt. Moved the appt to 110:00 instead of the original time of 11:20. Pt acknowledged and understands building number, time and late policy.

## 2018-05-07 ENCOUNTER — Ambulatory Visit: Payer: Self-pay | Admitting: Family Medicine

## 2018-05-29 ENCOUNTER — Ambulatory Visit (INDEPENDENT_AMBULATORY_CARE_PROVIDER_SITE_OTHER): Payer: Managed Care, Other (non HMO) | Admitting: Family Medicine

## 2018-05-29 ENCOUNTER — Encounter: Payer: Self-pay | Admitting: Family Medicine

## 2018-05-29 DIAGNOSIS — N926 Irregular menstruation, unspecified: Secondary | ICD-10-CM

## 2018-05-29 DIAGNOSIS — E281 Androgen excess: Secondary | ICD-10-CM

## 2018-05-29 DIAGNOSIS — D751 Secondary polycythemia: Secondary | ICD-10-CM

## 2018-05-29 DIAGNOSIS — L659 Nonscarring hair loss, unspecified: Secondary | ICD-10-CM | POA: Diagnosis not present

## 2018-05-29 DIAGNOSIS — E278 Other specified disorders of adrenal gland: Secondary | ICD-10-CM | POA: Insufficient documentation

## 2018-05-29 DIAGNOSIS — L639 Alopecia areata, unspecified: Secondary | ICD-10-CM

## 2018-05-29 DIAGNOSIS — E348 Other specified endocrine disorders: Secondary | ICD-10-CM

## 2018-05-29 DIAGNOSIS — F17200 Nicotine dependence, unspecified, uncomplicated: Secondary | ICD-10-CM

## 2018-05-29 DIAGNOSIS — E349 Endocrine disorder, unspecified: Secondary | ICD-10-CM

## 2018-05-29 DIAGNOSIS — R7989 Other specified abnormal findings of blood chemistry: Secondary | ICD-10-CM

## 2018-05-29 LAB — POCT CBC
GRANULOCYTE PERCENT: 59.7 % (ref 37–80)
HEMATOCRIT: 44.8 % — AB (ref 29–41)
HEMOGLOBIN: 15.4 g/dL — AB (ref 9.5–13.5)
Lymph, poc: 2.3 (ref 0.6–3.4)
MCH: 30.5 pg (ref 27–31.2)
MCHC: 34.3 g/dL (ref 31.8–35.4)
MCV: 89 fL (ref 76–111)
MID (cbc): 0.4 (ref 0–0.9)
MPV: 8.6 fL (ref 0–99.8)
POC GRANULOCYTE: 4.1 (ref 2–6.9)
POC LYMPH PERCENT: 34 %L (ref 10–50)
POC MID %: 6.3 %M (ref 0–12)
Platelet Count, POC: 268 10*3/uL (ref 142–424)
RBC: 5.04 M/uL (ref 4.04–5.48)
RDW, POC: 12.9 %
WBC: 6.8 10*3/uL (ref 4.6–10.2)

## 2018-05-29 MED ORDER — TESTOSTERONE CYPIONATE 200 MG/ML IM SOLN: 50.0000 mg | INTRAMUSCULAR | 0 refills | Status: DC

## 2018-05-29 MED ORDER — TESTOSTERONE CYPIONATE 200 MG/ML IM SOLN: 80.0000 mg | INTRAMUSCULAR | 0 refills | Status: DC

## 2018-05-29 MED ORDER — "SYRINGE 22G X 1-1/2"" 3 ML MISC": 1.0000 [IU] | 0 refills | Status: AC

## 2018-05-29 MED ORDER — "NEEDLE (DISP) 18G X 1-1/2"" MISC": 1.0000 [IU] | 0 refills | Status: AC

## 2018-05-29 MED ORDER — "NEEDLE (DISP) 18G X 1-1/2"" MISC": 1.0000 [IU] | 0 refills | Status: DC

## 2018-05-29 MED ORDER — "SYRINGE 22G X 1-1/2"" 3 ML MISC": 1.0000 [IU] | 0 refills | Status: DC

## 2018-05-29 NOTE — Progress Notes (Addendum)
Subjective:    Patient: Catherine Livingston "[name]"  DOB: 1996/04/21; 22 y.o.   MRN: [id number]  Chief Complaint  Patient presents with  . Establish Care    hormonal Treatment     HPI   Catherine Livingston is a 22 y.o. FTM transgendered person who was born biologically female and presents as a new patient accompanied by partner [name] to discuss starting female hormonal/testosterone therapy.   Been together for 3.5 yrs with only cis-female  Catherine Livingston realized at a young age when that he was off a female gender in a female body. He learned more about trans individuals which he realized he had always identified throughout his younger life. Came out to work, family, friends a while ago for about a 1.5 yrs Been presenting as female dress/appearance since .  In , he began presenting exclusively as female and assumed the name Catherine Livingston recently (not yet legally changed.)  He feels that everyone in his life is supportive - parents, friends, work, school.  No barriers to transition, no fears.  Still lives w/ mother and with [name]. A lot of trans friends. No concerns about transition but doesn't want to be angry all the time.   Been able to cope with everything but does help marijuana to cope currently.  Last on a year ago but doesn't like taking a pill a day so just stopped.    Interested in top surgery from Dr. Winferd Humphrey.     Started off smoking 35-18 yo and about 1 ppd since then - recently up to 1.5 ppd but has been able to cut down to 1 ppd. Lives with [name] and mother who both don't smoke cigarettes. Has considered stopping prior but not actually tried. Did vape for a little while but no other attempts at stopping.  Top surgery with Dr. Jonnie Finner with [facility] Has never used any illegal or illicit drugs prior including any hormone therapy prior. Does not have any contraindications to testosterone.  Menses began at 15-16 yo. Menses are irregular -often misses - might be 6 mos  between and then very heavy flow that last for 3-4d, never seen gyn or been worked up , no prior hx of any birth control, no h/o penetrative sex,  LMP 2 mos ago. Doesn't think he would ever want to conceive a natural child - if he wants to parent, will be happy to adopt or let female partner carry child. Pt's family is accepting of this - no desire to consider harvesting eggs prior to starting hormonal therapy.  PCP:  Started seeing [facility] at [facility] for [facility] therapy and did have psch eval done there. He has been evaluated and counselled and meets criteria for gender dysphoria. Brought letter from psychologist today stating that he has undergone psychological assessment to fulfill the requirements for cross-sex hormone treatment on  and found to be fit for treatment, as well as competent with capacity to make informed decision and capable of consenting to treatment. Pt is a candidate for testosterone according to the guidelines of WPATH, APA, and the Endocrine Society.  No h/o any psych problems but does feel that the hope of starting hormonal therapy to support his transition into his female gender identity will result in significant reduction of personal distress around his gender identity.  Medical History Past Medical History:  Diagnosis Date  . ADHD (attention deficit hyperactivity disorder)    took meds for ADD in 7th-9th grade,  stopped due to decrease appetitie  . Allergy   . Anxiety   . Depression   . Obesity    Past Surgical History:  Procedure Laterality Date  . ANKLE SURGERY     No current outpatient medications on file prior to visit.   No current facility-administered medications on file prior to visit.    Allergies  Allergen Reactions  . Other Other (See Comments)    Seasonal allergies causes runny nose   Family History  Problem Relation Age of Onset  . Depression Mother   . Depression Father   . Lung Livingston Maternal Grandmother   . Depression Maternal  Grandfather   . Prostate Livingston Paternal Grandfather   . Stroke Paternal Grandfather    Social History   Socioeconomic History  . Marital status: Single    Spouse name: Not on file  . Number of children: Not on file  . Years of education: Not on file  . Highest education level: Not on file  Occupational History  . Not on file  Social Needs  . Financial resource strain: Not on file  . Food insecurity:    Worry: Not on file    Inability: Not on file  . Transportation needs:    Medical: Not on file    Non-medical: Not on file  Tobacco Use  . Smoking status: Never Smoker  . Smokeless tobacco: Never Used  Substance and Sexual Activity  . Alcohol use: Yes    Alcohol/week: 0.0 standard drinks    Comment: occasional  . Drug use: Yes    Frequency: 4.0 times per week    Types: Other-see comments, Marijuana  . Sexual activity: Never  Lifestyle  . Physical activity:    Days per week: Not on file    Minutes per session: Not on file  . Stress: Not on file  Relationships  . Social connections:    Talks on phone: Not on file    Gets together: Not on file    Attends religious service: Not on file    Active member of club or organization: Not on file    Attends meetings of clubs or organizations: Not on file    Relationship status: Not on file  Other Topics Concern  . Not on file  Social History Narrative  . Not on file   Depression screen [facility] 2/9 08/26/2015  Decreased Interest 0  Down, Depressed, Hopeless 1  PHQ - 2 Score 1    ROS As noted in HPI  Objective:  There were no vitals taken for this visit. Physical Exam  Constitutional: She is oriented to person, place, and time. She appears well-developed and well-nourished. No distress.  HENT:  Head: Normocephalic and atraumatic.  Right Ear: External ear normal.  Left Ear: External ear normal.  Eyes: Conjunctivae are normal. No scleral icterus.  Neck: Normal range of motion. Neck supple. No thyromegaly present.    Cardiovascular: Normal rate, regular rhythm, normal heart sounds and intact distal pulses.  Pulmonary/Chest: Effort normal and breath sounds normal. No respiratory distress.  Musculoskeletal: She exhibits no edema.  Lymphadenopathy:    She has no cervical adenopathy.  Neurological: She is alert and oriented to person, place, and time.  Skin: Skin is warm and dry. She is not diaphoretic. No erythema.  Psychiatric: She has a normal mood and affect. Her behavior is normal.        [place] [facility] Office Visit on 05/29/2018  Component Date Value Ref Range Status  .  WBC 05/29/2018 6.8  4.6 - 10.2 K/uL Final  . Lymph, poc 05/29/2018 2.3  0.6 - 3.4 Final  . POC LYMPH PERCENT 05/29/2018 34.0  10 - 50 %L Final  . MID (cbc) 05/29/2018 0.4  0 - 0.9 Final  . POC MID % 05/29/2018 6.3  0 - 12 %M Final  . POC Granulocyte 05/29/2018 4.1  2 - 6.9 Final  . Granulocyte percent 05/29/2018 59.7  37 - 80 %G Final  . RBC 05/29/2018 5.04  4.04 - 5.48 M/uL Final  . Hemoglobin 05/29/2018 15.4* 9.5 - 13.5 g/dL Final  . HCT, POC 05/29/2018 44.8* 29 - 41 % Final  . MCV 05/29/2018 89.0  76 - 111 fL Final  . MCH, POC 05/29/2018 30.5  27 - 31.2 pg Final  . MCHC 05/29/2018 34.3  31.8 - 35.4 g/dL Final  . RDW, POC 05/29/2018 12.9  % Final  . Platelet Count, POC 05/29/2018 268  142 - 424 K/uL Final  . MPV 05/29/2018 8.6  0 - 99.8 fL Final     Assessment & Plan:   1. Endocrine disorder   2. Patchy loss of hair   3. Irregular menses   4. Tobacco use disorder   5. Concentric alopecia areata - apply high-potency topical corticosteroid - rx'd betamethasone dipropionate 0.05% lotion to affected area of scalp and 1 cm beyond qd.   Re-eval after 3 mos of use. If no hair regrowth at 3 mos, d/c top betamethasone and consider alternative therapies.  Reviewed that side effects of topical therapy include local skin atrophy, telangiectasias, hypopigmentation, and adrenal suppression If is having hair regrowth at 3 mo  recheck, continue the topical corticosteroid and gradually taper the frequency of application.   The best regimen for tapering and discontinuing topical corticosteroids in patients who respond is unclear. Individualize and taper more quickly if robust hair regrowth than if slower, less dramatic improvement. [facility] able to taper and d/c topical corticosteroid therapy over the next 3-6 mos.   6. Acquired polycythemia    Catherine Eaves Toya read and signed each page of the "risks of masculinizing Hormone Therapy (FtM)" of the [facility] 7th version Standards of Care found in Appendix C which will be scanned under the "Media" tab and serves as the informed consent for female hormonal therapy.   Reviewed that the physiologic and psychologic changes are unpredictable and can be uncontrollable and may be irreversible. Pt understands and agrees. Demonstrates excellent knowledge of the risks/benefits of hormonal treatment which he has reviewed in detail on his own and with therapist prior.Marland Kitchen Specifically reviewed that change in the voice timbre and deepening may occur within several months and is irreversible. Discussed in detail that starting hormonal treatment could cause infertility - he may not ever be able to conceive a child naturally or even harvest eggs, even if he should choose to go off testosterone later in life.   Discussed in detail the possible increased risk for the development of cardiac conditions, vascular disease, and diabetes with female lipid patterns and increased weight as well as the higher risk and incidence of cardiovascular disease in males.  Also could experience acne, emotional lability, anger, change in libido, weight gain, potential for benign and malignant liver tumors and hepatic dysfunction, and female pattern baldness in addition to other unknown or unpredictable effects.  Discussed that the physiologic changes induced by hormones may take anywhere from 3 mos to 2 yrs or longer.     Brought  letter from psychologist  at [place] stating that he has undergone psychological assessment to fulfill the requirements for cross-sex hormone treatment on . Has met all the eligibility and readiness criteria outlined in the office WPATH Standards of Care, 7th edition for the treatment of transgendered individuals. Is competent, has the capacity to make a fully informed decision, and is capable of consenting to treatment. Caramia A Bogle is a fit candidate for cross-sex hormone therapy.  Advised that should he decide to become sexually active with a female (his typical attraction gender), then he needs to let me know prior so we can start Depo-Provera shots (or nexplanon/IUD) as possible to become pregnant until menses have been gone x 6 mos but as long as has ovaries it is theoretically ossible that he could ovulate and on hormonal therapy could be teratogenic.   Patient will continue on current chronic medications other than changes noted above, so ok to refill when needed.   See after visit summary for patient specific instructions.  Orders Placed This Encounter  Procedures  . CBC with Differential/Platelet  . Comprehensive metabolic panel  . Estradiol  . TestT+TestF+SHBG    Meds ordered this encounter  Medications  . testosterone cypionate (DEPOTESTOSTERONE CYPIONATE) 200 MG/ML injection    Sig: Inject 0.25 mLs (50 mg total) into the muscle every 7 (seven) days.    Dispense:  10 mL    Refill:  0  . Syringe/Needle, Disp, (SYRINGE 3CC/22GX1-1/2") 22G X 1-1/2" 3 ML MISC    Sig: 1 Units by Does not apply route once a week.    Dispense:  50 each    Refill:  0  . NEEDLE, DISP, 18 G 18G X 1-1/2" MISC    Sig: 1 Units by Does not apply route once a week.    Dispense:  50 each    Refill:  0    Patient verbalized to me that they understand the following: diagnosis, what is being done for them, what to expect and what should be done at home.  Their questions have been answered.  They understand that I am unable to predict every possible medication interaction or adverse outcome and that if any unexpected symptoms arise, they should contact us and their pharmacist, as well as never hesitate to seek urgent/emergent care at [facility] or ER if they think it might be warranted.    ADDENDUM 08/06/18: Labs returned showing moderately elevated DHEA-S level - higher than typically seen with PCOS but not definitevly in the range concerning for an androgenizing adrenal tumor. From [facility]: Would have been helpful to measure a morning serum 17 hydroxyprogesterone.  Although this is ideally drawn in the early follicular phase, because patient is oligomenorrheic, it can be drawn on a random day.  U to TD notes that the clinical presentation of nonclassical congenital adrenal hyperplasia due to 21 hydroxylase deficiency should be ruled out in all women with possible PCOS as the clinical presentation is similar or identical of hyperandrogenism, oligomenorrhea, and polycystic ovaries.  There are risks that offspring could be affected with the more severe classical 21-hydroxylase deficiency so it is particularly important to test women whose ethnic heritage puts him at higher prevalence.  In this case Catherine Livingston has declared definitively that he has no wish to ever procreat a natural child, is in a monogamous relationship with a cis female negating the risk of accidental pregnancy, and is knowingly and with consent starting on medication which further decreases ovulation and the likelihood of future  reproduction.  Therefore, I do not think it is important at this time that he stopped his testosterone to get this additional testing done although, that may need to be reconsidered should he change his mind in the future about his desire and attempt to conceive a genetic child.  It also would have been useful to check a hCG, prolactin, and FSH to rule out other causes of oligomenorrhea.  The most common  cause of DHEAS elevation is the functional adrenal hyperandrogenism of PCOS.  If the DHEAS level is elevated due to a tumor of adrenal origin, will often be markedly elevated at greater than 700 mcg/d. DHEAS is a marker more of of adrenal androgen production, rather than ovarian but it has little intrinsic androgenic activity is small amounts are eventually converted to testosterone and the adrenal glands and peripheral tissues.   In women with PCOS studies have shown that the mean serum concentration of testosterone is often around 500 pg/mL compared to low 300s in the early follicular phase of normal women and the DHEAS level is on average around 350 mcg/dL compared to low 200s in normal women.   Over 60 min spent in face-to-face evaluation of and consultation with patient and coordination of care.  Over 50% of this time was spent counseling this patient regarding r/b of GAHT, smoking, alopecia areata  Delman Cheadle, MD, MPH Primary Care at [facility] [place], [place]  [address] [phone] Office phone  [phone] Office fax  05/29/18 12:25 PM

## 2018-05-29 NOTE — Patient Instructions (Addendum)
Typically, I will ask you to make your next follow-up office visit for recheck 2-3 months after any medication or hormone initiation or dose change, and then to follow-up every 6 months when you are on your stable hormone dose without problems or changes. If possible for you, I highly prefer that you come in to have your labs drawn 3-10 days prior to our office visit while trying to time the lab only visit to occur about in the middle of the time between injections.  If you have lab work done today you will be contacted with your lab results within the next 2 weeks.  If you have not heard from [place] then please contact us. The fastest way to get your results is to register for My Chart.   IF you received an x-ray today, you will receive an invoice from [facility]. Please contact [facility] at [phone] with questions or concerns regarding your invoice.   IF you received labwork today, you will receive an invoice from [place]. Please contact [facility] at [phone] with questions or concerns regarding your invoice.   Our billing staff will not be able to assist you with questions regarding bills from these companies.  You will be contacted with the lab results as soon as they are available. The fastest way to get your results is to activate your My Chart account. Instructions are located on the last page of this paperwork. If you have not heard from [place] regarding the results in 2 weeks, please contact this office.      The following plastic surgeon is very well-known for doing excellent work in female chest reconstructions and have VERY trans-friendly practices.  - Dr. Winferd Humphrey @ [facility], [email or web address] [address] [place], [place], [address]  CALL 1-800-QUIT-NOW   Alopecia Areata, Adult Alopecia areata is a condition that causes you to lose hair. You may lose hair on your scalp in patches. In some cases, you may lose all the hair on your  scalp (alopecia totalis) or all the hair from your face and body (alopecia universalis). Alopecia areata is an autoimmune disease. This means that your body's defense system (immune system) mistakes normal parts of the body for germs or other things that can make you sick. When you have alopecia areata, the immune system attacks the hair follicles. Alopecia areata usually develops in childhood, but it can develop at any age. For some people, their hair grows back on its own and hair loss does not happen again. For others, their hair may fall out and grow back in cycles. The hair loss may last many years. Having this condition can be emotionally difficult, but it is not dangerous. What are the causes? The cause of this condition is not known. What increases the risk? This condition is more likely to develop in people who have:  A family history of alopecia.  A family history of another autoimmune disease, including type 1 diabetes and rheumatoid arthritis.  Asthma and allergies.  Down syndrome.  What are the signs or symptoms? Round spots of patchy hair loss on the scalp is the main symptom of this condition. The spots may be mildly itchy. Other symptoms include:  Short dark hairs in the bald patches that are wider at the top (exclamation point hairs).  Dents, white spots, or lines in the fingernails or toenails.  Balding and body hair loss. This is rare.  How is this diagnosed? This condition is diagnosed based on your symptoms and family  history. Your health care provider will also check your scalp skin, teeth, and nails. Your health care provider may refer you to a specialist in hair and skin disorders (dermatologist). You may also have tests, including:  A hair pull test.  Blood tests or other screening tests to check for autoimmune diseases, such as thyroid disease or diabetes.  Skin biopsy to confirm the diagnosis.  A procedure to examine the skin with a lighted magnifying  instrument (dermoscopy).  How is this treated? There is no cure for alopecia areata. Treatment is aimed at promoting the regrowth of hair and preventing the immune system from overreacting. No single treatment is right for all people with alopecia areata. It depends on the type of hair loss you have and how severe it is. Work with your health care provider to find the best treatment for you. Treatment may include:  Having regular checkups to make sure the condition is not getting worse (watchful waiting).  Steroid creams or pills for 6-8 weeks to stop the immune reaction and help hair to regrow more quickly.  Other topical medicines to alter the immune system response and support the hair growth cycle.  Steroid injections.  Therapy and counseling with a support group or therapist if you are having trouble coping with hair loss.  Follow these instructions at home:  Learn as much as you can about your condition.  Apply topical creams only as told by your health care provider.  Take over-the-counter and prescription medicines only as told by your health care provider.  Consider getting a wig or products to make hair look fuller or to cover bald spots, if you feel uncomfortable with your appearance.  Get therapy or counseling if you are having a hard time coping with hair loss. Ask your health care provider to recommend a counselor or support group.  Keep all follow-up visits as told by your health care provider. This is important. Contact a health care provider if:  Your hair loss gets worse, even with treatment.  You have new symptoms.  You are struggling emotionally. Summary  Alopecia areata is an autoimmune condition that makes your body's defense system (immune system) attack the hair follicles. This causes you to lose hair.  Treatments may include regular checkups to make sure that the condition is not getting worse (watchful waiting), medicines, and steroid injections. This  information is not intended to replace advice given to you by your health care provider. Make sure you discuss any questions you have with your health care provider. Document Released: 02/05/2004 Document Revised: 07/21/2016 Document Reviewed: 07/21/2016 Elsevier Interactive Patient Education  2018 [place] Polycythemia vera (PV), or myeloproliferative disease, is a form of blood cancer in which the bone marrow makes too many (overproduces) red blood cells. The bone marrow may also make too many clotting cells (platelets) and white blood cells. Bone marrow is the spongy center of bones where blood cells are produced. Sometimes, there may be an overproduction of blood cells in the liver and spleen, causing those organs to become enlarged. Additionally, people who have PV are at a higher risk for stroke or heart attack because their blood may clot more easily. PV is a long-term disease. What are the causes? Almost all people who have PV have an abnormal gene (genetic mutation) that causes changes in the way that the bone marrow makes blood cells. This gene, which is called JAK2, is not passed along from parent to child (is not hereditary).  It is not known what triggers the genetic mutation that causes the body to produce too many red blood cells. What increases the risk? This condition is more likely to develop in:  Males.  People who are 47 years of age or older.  What are the signs or symptoms? You may not have any symptoms in the early stage of PV. When symptoms develop, they may include:  Shortness of breath.  Dizziness.  Hot and flushed skin.  Itchy skin.  Sweats, especially night sweats.  Headache.  Tiredness.  Ringing in the ears.  Blurred vision or blind spots.  Bone pain.  Weight loss.  Fever.  Blood-tinged vomit or bowel movements.  How is this diagnosed? This condition may be diagnosed during a routine physical exam if you have a blood  test called a complete blood count (CBC). Your health care provider also may suspect PV if you have symptoms. During the physical exam, your provider may find that you have an enlarged liver or spleen. You may also have tests to confirm the diagnosis. These may include:  A procedure to remove a sample of bone marrow for testing (bone marrow biopsy).  Blood tests to check for: ? The JAK2 gene. ? Low levels of a hormone that helps to regulate blood production (erythropoietin).  How is this treated? There is no cure for PV, but treatment can help to control the disease. There are several types of treatment. No single treatment works for everyone. You will need to work with a blood cancer specialist (hematologist) to find the treatment that is best for you. Options include:  Periodically having some blood removed with a needle (drawn) to lower the number of red blood cells (phlebotomy).  Medicine. Your health care provider may recommend: ? Low-dose aspirin to lower your risk for blood clots. ? A medicine to reduce red blood cell production (hydroxyurea). ? A medicine to lower the number of red blood cells (interferon). ? A medicine that slows down the effects of JAK2 (ruxolitinib).  Follow these instructions at home:  Take over-the-counter and prescription medicines only as told by your health care provider.  Return to your normal activities as told by your health care provider. Ask your health care provider what activities are safe for you.  Do not use tobacco products, including cigarettes, chewing tobacco, or e-cigarettes. If you need help quitting, ask your health care provider.  Keep all follow-up visits as told by your health care provider. This is important. Contact a health care provider if:  You have side effects from your medicines.  Your symptoms change or get worse at home.  You have blood in your stool or you vomit blood. Get help right away if:  You have sudden and  severe pain in your abdomen.  You have chest pain or difficulty breathing.  You have signs of stroke, such as: ? Sudden numbness. ? Weakness of your face or arm. ? Confusion. ? Difficulty speaking or understanding speech. These symptoms may represent a serious problem that is an emergency. Do not wait to see if the symptoms will go away. Get medical help right away. Call your local emergency services (911 in the [place] Do not drive yourself to the hospital. This information is not intended to replace advice given to you by your health care provider. Make sure you discuss any questions you have with your health care provider. Document Released: 03/28/2001 Document Revised: 12/09/2015 Document Reviewed: 01/13/2015 Elsevier Interactive Patient Education  [employer].  Coping with Quitting Smoking Quitting smoking is a physical and mental challenge. You will face cravings, withdrawal symptoms, and temptation. Before quitting, work with your health care provider to make a plan that can help you cope. Preparation can help you quit and keep you from giving in. How can I cope with cravings? Cravings usually last for 5-10 minutes. If you get through it, the craving will pass. Consider taking the following actions to help you cope with cravings:  Keep your mouth busy: ? Chew sugar-free gum. ? Suck on hard candies or a straw. ? Brush your teeth.  Keep your hands and body busy: ? Immediately change to a different activity when you feel a craving. ? Squeeze or play with a ball. ? Do an activity or a hobby, like making bead jewelry, practicing needlepoint, or working with wood. ? Mix up your normal routine. ? Take a short exercise break. Go for a quick walk or run up and down stairs. ? Spend time in public places where smoking is not allowed.  Focus on doing something kind or helpful for someone else.  Call a friend or family member to talk during a craving.  Join a support  group.  Call a quit line, such as 1-800-QUIT-NOW.  Talk with your health care provider about medicines that might help you cope with cravings and make quitting easier for you.  How can I deal with withdrawal symptoms? Your body may experience negative effects as it tries to get used to not having nicotine in the system. These effects are called withdrawal symptoms. They may include:  Feeling hungrier than normal.  Trouble concentrating.  Irritability.  Trouble sleeping.  Feeling depressed.  Restlessness and agitation.  Craving a cigarette.  To manage withdrawal symptoms:  Avoid places, people, and activities that trigger your cravings.  Remember why you want to quit.  Get plenty of sleep.  Avoid coffee and other caffeinated drinks. These may worsen some of your symptoms.  How can I handle social situations? Social situations can be difficult when you are quitting smoking, especially in the first few weeks. To manage this, you can:  Avoid parties, bars, and other social situations where people might be smoking.  Avoid alcohol.  Leave right away if you have the urge to smoke.  Explain to your family and friends that you are quitting smoking. Ask for understanding and support.  Plan activities with friends or family where smoking is not an option.  What are some ways I can cope with stress? Wanting to smoke may cause stress, and stress can make you want to smoke. Find ways to manage your stress. Relaxation techniques can help. For example:  Breathe slowly and deeply, in through your nose and out through your mouth.  Listen to soothing, relaxing music.  Talk with a family member or friend about your stress.  Light a candle.  Soak in a bath or take a shower.  Think about a peaceful place.  What are some ways I can prevent weight gain? Be aware that many people gain weight after they quit smoking. However, not everyone does. To keep from gaining weight, have a  plan in place before you quit and stick to the plan after you quit. Your plan should include:  Having healthy snacks. When you have a craving, it may help to: ? Eat plain popcorn, crunchy carrots, celery, or other cut vegetables. ? Chew sugar-free gum.  Changing how you eat: ? Eat small portion sizes at meals. ?  Eat 4-6 small meals throughout the day instead of 1-2 large meals a day. ? Be mindful when you eat. Do not watch television or do other things that might distract you as you eat.  Exercising regularly: ? Make time to exercise each day. If you do not have time for a long workout, do short bouts of exercise for 5-10 minutes several times a day. ? Do some form of strengthening exercise, like weight lifting, and some form of aerobic exercise, like running or swimming.  Drinking plenty of water or other low-calorie or no-calorie drinks. Drink 6-8 glasses of water daily, or as much as instructed by your health care provider.  Summary  Quitting smoking is a physical and mental challenge. You will face cravings, withdrawal symptoms, and temptation to smoke again. Preparation can help you as you go through these challenges.  You can cope with cravings by keeping your mouth busy (such as by chewing gum), keeping your body and hands busy, and making calls to family, friends, or a helpline for people who want to quit smoking.  You can cope with withdrawal symptoms by avoiding places where people smoke, avoiding drinks with caffeine, and getting plenty of rest.  Ask your health care provider about the different ways to prevent weight gain, avoid stress, and handle social situations. This information is not intended to replace advice given to you by your health care provider. Make sure you discuss any questions you have with your health care provider. Document Released: 06/30/2016 Document Revised: 06/30/2016 Document Reviewed: 06/30/2016 Elsevier Interactive Patient Education  [employer].

## 2018-05-30 ENCOUNTER — Telehealth: Payer: Self-pay

## 2018-05-30 LAB — DHEA-SULFATE: DHEA-SO4: 552.2 ug/dL — ABNORMAL HIGH (ref 110.0–431.7)

## 2018-05-30 LAB — C-REACTIVE PROTEIN: CRP: 2 mg/L (ref 0–10)

## 2018-05-30 LAB — IRON,TIBC AND FERRITIN PANEL
FERRITIN: 128 ng/mL (ref 15–150)
IRON SATURATION: 27 % (ref 15–55)
IRON: 97 ug/dL (ref 27–159)
TIBC: 364 ug/dL (ref 250–450)
UIBC: 267 ug/dL (ref 131–425)

## 2018-05-30 LAB — TSH: TSH: 1.8 u[IU]/mL (ref 0.450–4.500)

## 2018-05-30 NOTE — Telephone Encounter (Signed)
Spoke with Catherine Livingston about Dr. Alver FisherShaw's recommendations about the scalp cream to try for his condition and instructed him to give us a call if he feels that it is not working. Also informed him to continue with the Dermatology referral at that time if he notice the cream is not helping. He verbalized understanding.

## 2018-05-31 DIAGNOSIS — D751 Secondary polycythemia: Secondary | ICD-10-CM | POA: Insufficient documentation

## 2018-05-31 DIAGNOSIS — L639 Alopecia areata, unspecified: Secondary | ICD-10-CM | POA: Insufficient documentation

## 2018-05-31 DIAGNOSIS — F17200 Nicotine dependence, unspecified, uncomplicated: Secondary | ICD-10-CM | POA: Insufficient documentation

## 2018-05-31 LAB — CBC WITH DIFFERENTIAL/PLATELET
BASOS: 1 %
Basophils Absolute: 0 10*3/uL (ref 0.0–0.2)
EOS (ABSOLUTE): 0.2 10*3/uL (ref 0.0–0.4)
EOS: 2 %
HEMATOCRIT: 45.2 % (ref 34.0–46.6)
Hemoglobin: 15.1 g/dL (ref 11.1–15.9)
IMMATURE GRANULOCYTES: 0 %
Immature Grans (Abs): 0 10*3/uL (ref 0.0–0.1)
Lymphocytes Absolute: 2.5 10*3/uL (ref 0.7–3.1)
Lymphs: 36 %
MCH: 29.5 pg (ref 26.6–33.0)
MCHC: 33.4 g/dL (ref 31.5–35.7)
MCV: 88 fL (ref 79–97)
MONOS ABS: 0.6 10*3/uL (ref 0.1–0.9)
Monocytes: 8 %
Neutrophils Absolute: 3.7 10*3/uL (ref 1.4–7.0)
Neutrophils: 53 %
Platelets: 306 10*3/uL (ref 150–450)
RBC: 5.12 x10E6/uL (ref 3.77–5.28)
RDW: 12.5 % (ref 12.3–15.4)
WBC: 6.9 10*3/uL (ref 3.4–10.8)

## 2018-05-31 LAB — COMPREHENSIVE METABOLIC PANEL
A/G RATIO: 1.9 (ref 1.2–2.2)
ALT: 47 IU/L — ABNORMAL HIGH (ref 0–32)
AST: 17 IU/L (ref 0–40)
Albumin: 4.4 g/dL (ref 3.5–5.5)
Alkaline Phosphatase: 56 IU/L (ref 39–117)
BUN/Creatinine Ratio: 16 (ref 9–23)
BUN: 13 mg/dL (ref 6–20)
Bilirubin Total: 0.3 mg/dL (ref 0.0–1.2)
CO2: 21 mmol/L (ref 20–29)
Calcium: 9.5 mg/dL (ref 8.7–10.2)
Chloride: 104 mmol/L (ref 96–106)
Creatinine, Ser: 0.8 mg/dL (ref 0.57–1.00)
GFR, EST AFRICAN AMERICAN: 122 mL/min/{1.73_m2} (ref >59)
GFR, EST NON AFRICAN AMERICAN: 106 mL/min/{1.73_m2} (ref >59)
GLOBULIN, TOTAL: 2.3 g/dL (ref 1.5–4.5)
Glucose: 98 mg/dL (ref 65–99)
POTASSIUM: 4.8 mmol/L (ref 3.5–5.2)
Sodium: 139 mmol/L (ref 134–144)
Total Protein: 6.7 g/dL (ref 6.0–8.5)

## 2018-05-31 LAB — TESTT+TESTF+SHBG
Sex Hormone Binding: 23.2 nmol/L — ABNORMAL LOW (ref 24.6–122.0)
TESTOSTERONE FREE: 4.5 pg/mL — AB (ref 0.0–4.2)
Testosterone, total: 55.3 ng/dL — ABNORMAL HIGH (ref 10.0–55.0)

## 2018-05-31 LAB — ESTRADIOL: Estradiol: 59.9 pg/mL

## 2018-05-31 MED ORDER — BETAMETHASONE DIPROPIONATE 0.05 % EX LOTN: TOPICAL_LOTION | CUTANEOUS | 2 refills | Status: DC

## 2018-05-31 MED ORDER — BETAMETHASONE DIPROPIONATE 0.05 % EX LOTN: TOPICAL_LOTION | CUTANEOUS | 2 refills | Status: AC

## 2018-06-03 LAB — SEDIMENTATION RATE: SED RATE: 2 mm/h (ref 0–32)

## 2018-06-03 LAB — SPECIMEN STATUS REPORT

## 2018-06-03 LAB — HEMOGLOBIN A1C
Est. average glucose Bld gHb Est-mCnc: 114 mg/dL
Hgb A1c MFr Bld: 5.6 % (ref 4.8–5.6)

## 2018-07-31 ENCOUNTER — Telehealth: Payer: Self-pay | Admitting: Internal Medicine

## 2018-07-31 NOTE — Telephone Encounter (Signed)
Copied from CRM [id number]. Topic: Quick Communication - See Telephone Encounter >> Jul 31, 2018  4:19 PM Terisa Starr wrote: CRM for notification. See Telephone encounter for: 07/31/18. Pt would like to know does he need to refill testosterone cypionate (DEPOTESTOSTERONE CYPIONATE) 200 MG/ML injection or wait until next appt?

## 2018-07-31 NOTE — Telephone Encounter (Signed)
This encounter is auto populating for [facility]. This needs to go to Dr Clelia Croft at [place]

## 2018-08-05 ENCOUNTER — Telehealth: Payer: Self-pay | Admitting: Internal Medicine

## 2018-08-05 DIAGNOSIS — N913 Primary oligomenorrhea: Secondary | ICD-10-CM

## 2018-08-05 DIAGNOSIS — Z79899 Other long term (current) drug therapy: Secondary | ICD-10-CM

## 2018-08-05 DIAGNOSIS — E288 Other ovarian dysfunction: Secondary | ICD-10-CM

## 2018-08-05 NOTE — Telephone Encounter (Signed)
Copied from CRM [id number]. Topic: Quick Communication - Rx Refill/Question >> Aug 05, 2018  1:41 PM Jilda Roche wrote: Medication: testosterone cypionate (DEPOTESTOSTERONE CYPIONATE) 200 MG/ML injection   Has the patient contacted their pharmacy? No. (Agent: If no, request that the patient contact the pharmacy for the refill.) (Agent: If yes, when and what did the pharmacy advise?)  Preferred Pharmacy (with phone number or street name): [phone] [address], [place] - [address] [phone] (Phone) [phone] (Fax)    Agent: Please be advised that RX refills may take up to 3 business days. We ask that you follow-up with your pharmacy.

## 2018-08-06 NOTE — Telephone Encounter (Signed)
Please advise 

## 2018-08-07 ENCOUNTER — Telehealth: Payer: Self-pay | Admitting: Family Medicine

## 2018-08-07 MED ORDER — TESTOSTERONE CYPIONATE 200 MG/ML IM SOLN: 80.0000 mg | INTRAMUSCULAR | 0 refills | Status: DC

## 2018-08-07 NOTE — Telephone Encounter (Signed)
Copied from CRM [id number]. Topic: General - Other >> Aug 07, 2018  3:51 PM Jilda Roche wrote: Reason for CRM: Patient called stating that his pharmacy told him that they sent a form to office and for him to call and see if they were working on something for his Insurance, I called office and Carolanne Grumbling states that they have not received anything from the pharmacy but she thinks that the medication estosterone cypionate (DEPOTESTOSTERONE CYPIONATE) 200 MG/ML injection is needing a prior auth, she will speak to whomever handles these.   Best call back is [phone]

## 2018-08-07 NOTE — Telephone Encounter (Signed)
PA is needed for Medication refill and has been placed in the PA box for review.

## 2018-08-07 NOTE — Telephone Encounter (Signed)
Refill sent in. Informed in other message.

## 2018-08-07 NOTE — Telephone Encounter (Signed)
Please see note below. 

## 2018-08-07 NOTE — Telephone Encounter (Signed)
LAB POOL: Yes, I will provide refill and send to pharm but would also like pt to come by office for lab only visit so we can get a first idea of how his body is responding to the starting dose - aim for lab draw at the HALF-WAY point between injections - make sure he lets the person drawing his blood know when his last inj was and when his next will be so they can document it in the "lab only visit" note for that day.  SCHEDULING POOL: looks like pt is currently scheduled for 2/20 - please let him know that due to my broken ankle and non-weightbearing AS OF NOW (has changed sev time) I may not be back in clinic until Tues 2/25 (this isn't yet official from HR - just what I suspect per my ortho) so recommend that he go ahead and resched his appt for later. I recommend rescheduling for mid to end of March - that way if we make any changes to his medication regimen from his lab-only visit (see above request), it would have been adequate time since the change (minimum of 6 weeks) that we can get an accurate picture of if the new dose is safe, adequate, better, optimal, etc.  If the dose isn't changed after the lab only visit, he will then have 6 mos of meds (longest length a rx will be active for per the DEA) from the later appt date until he has to re-establish with me elsewhere (although currently there is no where - hopefully that will change in the next 2 months, but if not, I do have a "back-up" coverage plan in place for all of my trans pts so that we can all rest assured that, no matter what, [facility] care and [facility]xs - will give details if needed at next visit or send in letter if needed.

## 2018-08-13 ENCOUNTER — Telehealth: Payer: Self-pay | Admitting: Family Medicine

## 2018-08-13 DIAGNOSIS — L639 Alopecia areata, unspecified: Secondary | ICD-10-CM | POA: Diagnosis not present

## 2018-08-13 NOTE — Telephone Encounter (Signed)
Copied from CRM [id number]. Topic: Quick Communication - Rx Refill/Question >> Aug 13, 2018  1:39 PM Catherine Livingston E wrote: Medication: testosterone cypionate (DEPOTESTOSTERONE CYPIONATE) 200 MG/ML injection - PA is needed/ insurance company stated they have not received anything from the provider office  Has the patient contacted their pharmacy? PA needed   Preferred Pharmacy (with phone number or street name[id number] [place],  - [address] [phone] (Phone) [phone] (Fax)    Agent: Please be advised that RX refills may take up to 3 business days. We ask that you follow-up with your pharmacy.

## 2018-08-14 NOTE — Telephone Encounter (Signed)
PA was sent on 08/13/2018. Waiting for the insurance company.   Key: [id number]

## 2018-08-15 NOTE — Telephone Encounter (Signed)
Patient was approved for the medication testosterone, spoke with the pharmacy and they are going to go ahead and fill it for the patient.

## 2018-08-16 NOTE — Addendum Note (Signed)
Addended by: Sherren Mocha on: 08/16/2018 01:50 AM   Modules accepted: Orders

## 2018-09-02 ENCOUNTER — Other Ambulatory Visit: Payer: Self-pay | Admitting: Family Medicine

## 2018-09-02 DIAGNOSIS — E281 Androgen excess: Secondary | ICD-10-CM

## 2018-09-02 DIAGNOSIS — E348 Other specified endocrine disorders: Secondary | ICD-10-CM

## 2018-09-02 DIAGNOSIS — E278 Other specified disorders of adrenal gland: Secondary | ICD-10-CM

## 2018-09-02 DIAGNOSIS — R7989 Other specified abnormal findings of blood chemistry: Secondary | ICD-10-CM

## 2018-09-02 NOTE — Progress Notes (Unsigned)
Email from [place]:   You are right (and not at all overkill). The two big rule outs as you already have considered are NCCAH and PCOS. As far as the work up, since you already have met the dx criteria for PCOS (2/3 elevated androgens, virilization, and cysts), but really the only other big thing you need to r/o is NCCAH - which a 17OHP will do.  T should not change 17-OHP because having high androgen levels doesn't have a feedback effect (which corticosteroids would). So you can draw that any time. If you sent LH/FSH T could change those levels, but those are unlikely to help in the work-up. PL would not be influenced by it (except theoretically by lowering E a bit). However if he has a pituitary problem, E level wouldn't matter anyway if it was really high or really low. So I don't think you need to take him off meds now. I would send the 17OHP and get the pelvic US definitely. You could do the adrenal [place], but it's unlikely to be abnormal if 17-OHP isn't, but it's reasonable to do since he's getting a pelvic US anyway.  One caveat, I would ask him about ability to do a TVUS. Really what you are looking for is big ovaries with cysts. If he's not obese, and has a really full bladder TAUS should be sufficient to see that. So if he's going to have a problem with the TVUS, tell the [place] tech what you are looking for so they can focus on that.  As for endo referral: if you have a slam dunk dx of PCOS (has 3 or 3 criteria after the [place]) and he has a normal 17-OHP, you may not need to refer to endocrine if you are comfortable enough with that dx. However if he either doesn't have ovarian cysts or 17-OHP is positive, would definitely refer him to endo. As to whether or not they will want him off T for some obscure endo lab test - that is possible, but if they need to, they can take him off then.  Hope that helps! Noralee Chars, MD [facility] provides health information and  medical advice to medical providers in practice. This advice does not replace the independent evaluation of a patient that an appropriate consultation would provide. If you would prefer to have your patient seen by one of our transgender health specialists, please notify us separately and we will see if this can be arranged.  [facility] does not respond immediately to queries - responses generally occur within 2 business days. If you have an urgent request, please do not use this service, but instead contact us by phone at: [phone].   From Dr. Cruzita Lederer -  I think you did a great job helping him with this! I do not usually treat transgender patient so please take this is an opinion from somebody who does not treat this particular condition.   However, based on my experience with PCOS patients, they may have a slightly elevated DHEAS, without an associated pathology. In this patient's case, it is most likely that he had PCOS with an associated slight increase in DHEAS. I would recommend to continue to check this yearly and have at least some imaging of his adrenals just to make sure that there is no suspicious mass, but I do not think that other investigation is necessary for this.  17 hydroxyprogesterone would probably not have helped much. Unless you suspect classic CAH, which  would have been diagnosed in childhood if not in infancy, nonclassical CAH does not necessitate specific treatment other than oral contraceptives in females.   Regarding prolactin, sure, you can definitely check this even now, with his next set of labs, but based on the picture, I do not suspect this to be the problem.  I would continue his testosterone.  Hope this helps,  [place]

## 2018-09-05 ENCOUNTER — Ambulatory Visit: Payer: Managed Care, Other (non HMO) | Admitting: Family Medicine

## 2018-09-10 ENCOUNTER — Ambulatory Visit: Payer: BLUE CROSS/BLUE SHIELD

## 2018-09-10 DIAGNOSIS — R7989 Other specified abnormal findings of blood chemistry: Secondary | ICD-10-CM

## 2018-09-10 DIAGNOSIS — E348 Other specified endocrine disorders: Secondary | ICD-10-CM | POA: Diagnosis not present

## 2018-09-10 DIAGNOSIS — N913 Primary oligomenorrhea: Secondary | ICD-10-CM

## 2018-09-10 DIAGNOSIS — E278 Other specified disorders of adrenal gland: Secondary | ICD-10-CM | POA: Diagnosis not present

## 2018-09-10 DIAGNOSIS — E288 Other ovarian dysfunction: Secondary | ICD-10-CM

## 2018-09-10 DIAGNOSIS — E281 Androgen excess: Secondary | ICD-10-CM

## 2018-09-10 DIAGNOSIS — Z79899 Other long term (current) drug therapy: Secondary | ICD-10-CM

## 2018-09-13 LAB — COMPREHENSIVE METABOLIC PANEL
ALK PHOS: 66 IU/L (ref 39–117)
ALT: 38 IU/L — ABNORMAL HIGH (ref 0–32)
AST: 22 IU/L (ref 0–40)
Albumin/Globulin Ratio: 2 (ref 1.2–2.2)
Albumin: 4.7 g/dL (ref 3.9–5.0)
BUN/Creatinine Ratio: 14 (ref 9–23)
BUN: 13 mg/dL (ref 6–20)
Bilirubin Total: 0.4 mg/dL (ref 0.0–1.2)
CHLORIDE: 104 mmol/L (ref 96–106)
CO2: 19 mmol/L — ABNORMAL LOW (ref 20–29)
Calcium: 9.6 mg/dL (ref 8.7–10.2)
Creatinine, Ser: 0.96 mg/dL (ref 0.57–1.00)
GFR calc Af Amer: 98 mL/min/{1.73_m2} (ref >59)
GFR calc non Af Amer: 85 mL/min/{1.73_m2} (ref >59)
Globulin, Total: 2.4 g/dL (ref 1.5–4.5)
Glucose: 89 mg/dL (ref 65–99)
Potassium: 4.7 mmol/L (ref 3.5–5.2)
Sodium: 141 mmol/L (ref 134–144)
Total Protein: 7.1 g/dL (ref 6.0–8.5)

## 2018-09-13 LAB — CBC
Hematocrit: 49.4 % — ABNORMAL HIGH (ref 34.0–46.6)
Hemoglobin: 16.9 g/dL — ABNORMAL HIGH (ref 11.1–15.9)
MCH: 29.9 pg (ref 26.6–33.0)
MCHC: 34.2 g/dL (ref 31.5–35.7)
MCV: 87 fL (ref 79–97)
PLATELETS: 304 10*3/uL (ref 150–450)
RBC: 5.65 x10E6/uL — ABNORMAL HIGH (ref 3.77–5.28)
RDW: 12.4 % (ref 11.7–15.4)
WBC: 7.6 10*3/uL (ref 3.4–10.8)

## 2018-09-13 LAB — TESTT+TESTF+SHBG
Sex Hormone Binding: 17.4 nmol/L — ABNORMAL LOW (ref 24.6–122.0)
TESTOSTERONE, TOTAL, LC/MS: 288.9 ng/dL — AB (ref 10.0–55.0)
Testosterone, Free: 13.9 pg/mL — ABNORMAL HIGH (ref 0.0–4.2)

## 2018-09-13 LAB — PROLACTIN: Prolactin: 12.8 ng/mL (ref 4.8–23.3)

## 2018-09-13 LAB — ESTRADIOL: ESTRADIOL: 39.8 pg/mL

## 2018-09-13 LAB — 17-HYDROXYPROGESTERONE: 17-Hydroxyprogesterone: 49 ng/dL

## 2018-10-10 DIAGNOSIS — Z87890 Personal history of sex reassignment: Secondary | ICD-10-CM | POA: Diagnosis not present

## 2018-10-10 DIAGNOSIS — E282 Polycystic ovarian syndrome: Secondary | ICD-10-CM | POA: Diagnosis not present

## 2018-10-10 DIAGNOSIS — Z720 Tobacco use: Secondary | ICD-10-CM | POA: Diagnosis not present

## 2018-10-10 DIAGNOSIS — R7301 Impaired fasting glucose: Secondary | ICD-10-CM | POA: Diagnosis not present

## 2018-10-12 ENCOUNTER — Other Ambulatory Visit: Payer: Self-pay | Admitting: Family Medicine

## 2018-10-12 NOTE — Telephone Encounter (Signed)
Last prescribed by previous pcp, forwarding to new pcp office to address

## 2018-10-17 NOTE — Telephone Encounter (Signed)
I do not prescribe this. I saw she has been seeing Dr. Clelia Croft? Is she no longer following with her for this? I can refer to [facility] if she would be interested.

## 2018-10-24 ENCOUNTER — Other Ambulatory Visit: Payer: Self-pay | Admitting: Internal Medicine

## 2018-10-24 NOTE — Telephone Encounter (Signed)
Patient called and was advised Dr. Clelia Croft is no longer at [facility] and asked if he would be following her to the new location, he says he is planning on following her and will call back to get the phone number. He says that he has maybe 1 more dose left and asked if a doctor at [facility] would refill it for him. I advised of the 3 day turn around for refills.

## 2018-10-31 MED ORDER — TESTOSTERONE CYPIONATE 200 MG/ML IM SOLN: 80.0000 mg | INTRAMUSCULAR | 0 refills | Status: AC

## 2019-01-07 DIAGNOSIS — Z20828 Contact with and (suspected) exposure to other viral communicable diseases: Secondary | ICD-10-CM | POA: Diagnosis not present

## 2019-02-04 ENCOUNTER — Ambulatory Visit (INDEPENDENT_AMBULATORY_CARE_PROVIDER_SITE_OTHER): Payer: BC Managed Care – PPO | Admitting: Family Medicine

## 2019-02-04 ENCOUNTER — Other Ambulatory Visit: Payer: Self-pay

## 2019-02-04 ENCOUNTER — Encounter: Payer: Self-pay | Admitting: Family Medicine

## 2019-02-04 VITALS — BP 124/74 | HR 59 | Temp 98.4°F | Resp 16 | Ht 66.5 in | Wt 195.2 lb

## 2019-02-04 DIAGNOSIS — S61259A Open bite of unspecified finger without damage to nail, initial encounter: Secondary | ICD-10-CM | POA: Diagnosis not present

## 2019-02-04 DIAGNOSIS — L089 Local infection of the skin and subcutaneous tissue, unspecified: Secondary | ICD-10-CM

## 2019-02-04 DIAGNOSIS — S61452A Open bite of left hand, initial encounter: Secondary | ICD-10-CM | POA: Diagnosis not present

## 2019-02-04 DIAGNOSIS — W540XXA Bitten by dog, initial encounter: Secondary | ICD-10-CM

## 2019-02-04 MED ORDER — AMOXICILLIN-POT CLAVULANATE 875-125 MG PO TABS: 1.0000 | ORAL_TABLET | Freq: Two times a day (BID) | ORAL | 0 refills | Status: DC

## 2019-02-04 NOTE — Progress Notes (Signed)
   Subjective:     Catherine Livingston is a 23 y.o. adult presenting for Animal Bite (happened on 02/03/2019. left hand area is red and swollen. )     HPI   #Dog bite - 02/03/2019 - left hand area - red and swollen - Cleaning: soap, water, and hydrogen peroxide  - difficulty closing it - has been oozing - with pus drainage   Review of Systems  Constitutional: Negative for chills and fever.  Gastrointestinal: Positive for nausea (from pain). Negative for vomiting.  Skin: Positive for wound (dog bite hand).     Social History   Tobacco Use  Smoking Status Never Smoker  Smokeless Tobacco Never Used        Objective:    BP Readings from Last 3 Encounters:  02/04/19 124/74  03/15/18 110/60  08/27/17 100/64   Wt Readings from Last 3 Encounters:  02/04/19 195 lb 4 oz (88.6 kg)  03/15/18 216 lb (98 kg)  08/27/17 214 lb 8 oz (97.3 kg)    BP 124/74   Pulse (!) 59   Temp 98.4 F (36.9 C)   Resp 16   Ht 5' 6.5" (1.689 m)   Wt 195 lb 4 oz (88.6 kg)   BMI 31.04 kg/m    Physical Exam Constitutional:      General: He is not in acute distress.    Appearance: He is well-developed. He is not diaphoretic.  HENT:     Right Ear: External ear normal.     Left Ear: External ear normal.     Nose: Nose normal.  Eyes:     Conjunctiva/sclera: Conjunctivae normal.  Neck:     Musculoskeletal: Neck supple.  Cardiovascular:     Rate and Rhythm: Normal rate.  Pulmonary:     Effort: Pulmonary effort is normal.  Musculoskeletal:     Comments: Left pinky finger: normal sensation. But with difficulty fully straightening the finger and pain with full flexion.   Skin:    General: Skin is warm and dry.     Capillary Refill: Capillary refill takes less than 2 seconds.     Comments: Left pinky finger: bite mark below the nail and along the PIP dorsal joint. Some purulent drainage present at both sites. Swelling of the entire finger with erythema extending to the hand.    Neurological:     Mental Status: He is alert. Mental status is at baseline.  Psychiatric:        Mood and Affect: Mood normal.        Behavior: Behavior normal.           Assessment & Plan:   Problem List Items Addressed This Visit    None    Visit Diagnoses    Dog bite of left hand including fingers with infection, initial encounter    -  Primary   Relevant Medications   amoxicillin-clavulanate (AUGMENTIN) 875-125 MG tablet   Other Relevant Orders   Ambulatory referral to Hand Surgery     No clear abscess and the wound had already been cleaned. Signs of infection present. Start Abx  Strict return/ER precautions. Urgent hand surgery if not improving and for follow-up if worsening  Return if symptoms worsen or fail to improve.  Lesleigh Noe, MD

## 2019-02-04 NOTE — Patient Instructions (Addendum)
#  Dog bite - due to being on your hand we should refer to surgery - if you notice immediate improvement then you can probably defer the appointment - take the antibiotic  - if you notice worsening redness, swelling, fever, chills.

## 2019-02-05 ENCOUNTER — Other Ambulatory Visit: Payer: Self-pay

## 2019-02-05 ENCOUNTER — Encounter (HOSPITAL_COMMUNITY): Payer: Self-pay | Admitting: Obstetrics and Gynecology

## 2019-02-05 ENCOUNTER — Telehealth: Payer: Self-pay | Admitting: Family Medicine

## 2019-02-05 ENCOUNTER — Emergency Department (HOSPITAL_COMMUNITY): Payer: BC Managed Care – PPO

## 2019-02-05 ENCOUNTER — Emergency Department (HOSPITAL_COMMUNITY)
Admission: EM | Admit: 2019-02-05 | Discharge: 2019-02-05 | Disposition: A | Discharge status: Home / Self Care | Payer: BC Managed Care – PPO | Attending: Emergency Medicine | Admitting: Emergency Medicine

## 2019-02-05 DIAGNOSIS — M659 Synovitis and tenosynovitis, unspecified: Secondary | ICD-10-CM

## 2019-02-05 DIAGNOSIS — M65842 Other synovitis and tenosynovitis, left hand: Secondary | ICD-10-CM | POA: Diagnosis not present

## 2019-02-05 DIAGNOSIS — Z79899 Other long term (current) drug therapy: Secondary | ICD-10-CM | POA: Insufficient documentation

## 2019-02-05 DIAGNOSIS — L02512 Cutaneous abscess of left hand: Secondary | ICD-10-CM | POA: Diagnosis not present

## 2019-02-05 DIAGNOSIS — L03114 Cellulitis of left upper limb: Secondary | ICD-10-CM | POA: Insufficient documentation

## 2019-02-05 DIAGNOSIS — L02519 Cutaneous abscess of unspecified hand: Secondary | ICD-10-CM

## 2019-02-05 DIAGNOSIS — S61452A Open bite of left hand, initial encounter: Secondary | ICD-10-CM | POA: Diagnosis not present

## 2019-02-05 LAB — CBC WITH DIFFERENTIAL/PLATELET
Abs Immature Granulocytes: 0.02 10*3/uL (ref 0.00–0.07)
Basophils Absolute: 0 10*3/uL (ref 0.0–0.1)
Basophils Relative: 0 %
Eosinophils Absolute: 0.1 10*3/uL (ref 0.0–0.5)
Eosinophils Relative: 1 %
HCT: 50.2 % — ABNORMAL HIGH (ref 36.0–46.0)
Hemoglobin: 16.2 g/dL — ABNORMAL HIGH (ref 12.0–15.0)
Immature Granulocytes: 0 %
Lymphocytes Relative: 34 %
Lymphs Abs: 1.9 10*3/uL (ref 0.7–4.0)
MCH: 29.1 pg (ref 26.0–34.0)
MCHC: 32.3 g/dL (ref 30.0–36.0)
MCV: 90.1 fL (ref 80.0–100.0)
Monocytes Absolute: 0.6 10*3/uL (ref 0.1–1.0)
Monocytes Relative: 11 %
Neutro Abs: 3 10*3/uL (ref 1.7–7.7)
Neutrophils Relative %: 54 %
Platelets: 257 10*3/uL (ref 150–400)
RBC: 5.57 MIL/uL — ABNORMAL HIGH (ref 3.87–5.11)
RDW: 12.8 % (ref 11.5–15.5)
WBC: 5.6 10*3/uL (ref 4.0–10.5)
nRBC: 0 % (ref 0.0–0.2)

## 2019-02-05 LAB — BASIC METABOLIC PANEL
Anion gap: 10 (ref 5–15)
BUN: 13 mg/dL (ref 6–20)
CO2: 23 mmol/L (ref 22–32)
Calcium: 9 mg/dL (ref 8.9–10.3)
Chloride: 104 mmol/L (ref 98–111)
Creatinine, Ser: 0.84 mg/dL (ref 0.44–1.00)
GFR calc Af Amer: >60 mL/min (ref >60)
GFR calc non Af Amer: >60 mL/min (ref >60)
Glucose, Bld: 91 mg/dL (ref 70–99)
Potassium: 4.4 mmol/L (ref 3.5–5.1)
Sodium: 137 mmol/L (ref 135–145)

## 2019-02-05 MED ORDER — VANCOMYCIN HCL 10 G IV SOLR
1750.0000 mg | Freq: Once | INTRAVENOUS | Status: AC
  Administered 2019-02-05: 16:00:00 1750 mg via INTRAVENOUS
  Filled 2019-02-05: qty 1750

## 2019-02-05 MED ORDER — MORPHINE SULFATE (PF) 4 MG/ML IV SOLN
8.0000 mg | INTRAVENOUS | Status: DC | PRN
  Filled 2019-02-05: qty 2

## 2019-02-05 MED ORDER — LIDOCAINE-EPINEPHRINE (PF) 2 %-1:200000 IJ SOLN
10.0000 mL | Freq: Once | INTRAMUSCULAR | Status: AC
  Administered 2019-02-05: 10 mL
  Filled 2019-02-05: qty 10

## 2019-02-05 MED ORDER — VANCOMYCIN HCL IN DEXTROSE 1-5 GM/200ML-% IV SOLN: 1000.0000 mg | Freq: Once | INTRAVENOUS | Status: DC

## 2019-02-05 MED ORDER — SODIUM CHLORIDE 0.9 % IV SOLN
3.0000 g | Freq: Once | INTRAVENOUS | Status: AC
  Administered 2019-02-05: 3 g via INTRAVENOUS
  Filled 2019-02-05: qty 8

## 2019-02-05 MED ORDER — HYDROMORPHONE HCL 1 MG/ML IJ SOLN
1.0000 mg | Freq: Once | INTRAMUSCULAR | Status: AC
  Administered 2019-02-05: 14:00:00 1 mg via INTRAVENOUS
  Filled 2019-02-05: qty 1

## 2019-02-05 MED ORDER — DOXYCYCLINE HYCLATE 100 MG PO CAPS: 100.0000 mg | ORAL_CAPSULE | Freq: Two times a day (BID) | ORAL | 0 refills | Status: AC

## 2019-02-05 MED ORDER — BACITRACIN ZINC 500 UNIT/GM EX OINT: TOPICAL_OINTMENT | Freq: Two times a day (BID) | CUTANEOUS | Status: DC

## 2019-02-05 NOTE — ED Provider Notes (Signed)
  Physical Exam  BP (!) 142/75 (BP Location: Left Arm)   Pulse 68   Temp 99 F (37.2 C) (Oral)   Resp 15   SpO2 98%     ED Course/Procedures     .Marland KitchenIncision and Drainage  Date/Time: 02/05/2019 4:26 PM Performed by: Sherwood Gambler, MD Authorized by: Sherwood Gambler, MD   Consent:    Consent obtained:  Verbal   Consent given by:  Patient Location:    Type:  Abscess   Size:  1 cm   Location:  Upper extremity   Upper extremity location:  Finger   Finger location:  L small finger Pre-procedure details:    Skin preparation:  Betadine Anesthesia (see MAR for exact dosages):    Anesthesia method:  Nerve block   Block location:  Digital block   Block needle gauge:  25 G   Block anesthetic:  Lidocaine 2% WITH epi   Block injection procedure:  Anatomic landmarks identified, anatomic landmarks palpated, negative aspiration for blood, introduced needle and incremental injection   Block outcome:  Anesthesia achieved Procedure type:    Complexity:  Simple Procedure details:    Incision types:  Single straight   Incision depth:  Dermal   Scalpel blade:  11   Drainage:  Bloody   Drainage amount:  Scant   Wound treatment:  Wound left open   Packing materials:  None Post-procedure details:    Patient tolerance of procedure:  Tolerated well, no immediate complications .Marland KitchenIncision and Drainage  Date/Time: 02/05/2019 4:27 PM Performed by: Sherwood Gambler, MD Authorized by: Sherwood Gambler, MD   Consent:    Consent obtained:  Verbal   Consent given by:  Patient Location:    Type:  Abscess   Size:  0.5   Location:  Upper extremity   Upper extremity location:  Finger   Finger location:  L small finger Pre-procedure details:    Skin preparation:  Betadine Anesthesia (see MAR for exact dosages):    Anesthesia method:  Nerve block   Block location:  Digital block   Block needle gauge:  25 G   Block anesthetic:  Lidocaine 2% WITH epi   Block technique:  Digital   Block  injection procedure:  Anatomic landmarks identified, introduced needle, anatomic landmarks palpated, negative aspiration for blood and incremental injection Procedure type:    Complexity:  Simple Procedure details:    Incision types:  Single straight   Incision depth:  Dermal   Scalpel blade:  11   Drainage:  Bloody   Drainage amount:  Scant   Wound treatment:  Wound left open   Packing materials:  None Post-procedure details:    Patient tolerance of procedure:  Tolerated well, no immediate complications    MDM  Care transferred to me.  Asked to I&D the 2 areas to the left pinky finger, over PIP and just proximal to the nail.  Both of these were incised after good anesthesia.  However there was only bloody drainage and no significant purulent material.  Will place in splint we discussed wound care and the adjustment in antibiotics.       Sherwood Gambler, MD 02/05/19 [phone]

## 2019-02-05 NOTE — Discharge Instructions (Signed)
Please call Dr. Apolonio Schneiders, hand surgery and follow-up with them.  Start taking additional antibiotic as prescribed.

## 2019-02-05 NOTE — ED Provider Notes (Signed)
[facility]-EMERGENCY DEPT Provider Note   CSN: [id number] Arrival date & time: 02/05/19  1307     History   Chief Complaint Chief Complaint  Patient presents with  . Animal Bite    HPI Raina MinaMary A Guillot is a 23 y.o. adult.     HPI  23 year old patient comes in a chief complaint of abscess to the finger.  2 days ago patient was bit by his own dog.  Patient states that yesterday he saw his PCP and was started on antibiotics.  Today there has been progression discomfort and pain to the left hand, particularly the small finger.  He is also had some clear discharge.  Patient's PCP called hand surgery, and they were advised that patient come to the ER for further evaluation.  Patient denies any nausea, vomiting, fevers, chills.  Past Medical History:  Diagnosis Date  . ADHD (attention deficit hyperactivity disorder)    took meds for ADD in 7th-9th grade, stopped due to decrease appetitie  . Allergy   . Anxiety   . Depression   . Obesity     Patient Active Problem List   Diagnosis Date Noted  . Tobacco use disorder 05/31/2018  . Concentric alopecia areata 05/31/2018  . Acquired polycythemia 05/31/2018  . Elevated DHEA (HCC) 05/29/2018  . Facial rash 03/15/2018  . Irregular menses 10/21/2013  . ADHD (attention deficit hyperactivity disorder), combined type 10/21/2013    Past Surgical History:  Procedure Laterality Date  . ANKLE SURGERY       OB History   No obstetric history on file.      Home Medications    Prior to Admission medications   Medication Sig Start Date End Date Taking? Authorizing Provider  amoxicillin-clavulanate (AUGMENTIN) 875-125 MG tablet Take 1 tablet by mouth 2 (two) times daily. 02/04/19   Lynnda Childody, Jessica R, MD  betamethasone dipropionate 0.05 % lotion Apply directly to scalp at the well-defined areas of hair loss only and 1 cm beyond once daily 05/31/18   Sherren MochaShaw, Eva N, MD  doxycycline (VIBRAMYCIN) 100 MG capsule Take 1  capsule (100 mg total) by mouth 2 (two) times daily for 5 days. 02/06/19 02/11/19  Derwood KaplanNanavati, Ambrie Carte, MD  metFORMIN (GLUCOPHAGE-XR) 500 MG 24 hr tablet TAKE 1 TABLET BY MOUTH EVERY DAY IN THE EVENING WITH FOOD 10/10/18   [provider]  NEEDLE, DISP, 18 G 18G X 1-1/2" MISC 1 Units by Does not apply route once a week. 05/29/18   Sherren MochaShaw, Eva N, MD  Syringe/Needle, Disp, (SYRINGE 3CC/22GX1-1/2") 22G X 1-1/2" 3 ML MISC 1 Units by Does not apply route once a week. 05/29/18   Sherren MochaShaw, Eva N, MD  testosterone cypionate (DEPOTESTOSTERONE CYPIONATE) 200 MG/ML injection Inject 0.4 mLs (80 mg total) into the muscle every 14 (fourteen) days. 10/31/18   Doristine BosworthStallings, Zoe A, MD    Family History Family History  Problem Relation Age of Onset  . Depression Mother   . Depression Father   . Lung cancer Maternal Grandmother   . Depression Maternal Grandfather   . Prostate cancer Paternal Grandfather   . Stroke Paternal Grandfather     Social History Social History   Tobacco Use  . Smoking status: Never Smoker  . Smokeless tobacco: Never Used  Substance Use Topics  . Alcohol use: Yes    Alcohol/week: 0.0 standard drinks    Comment: occasional  . Drug use: Yes    Frequency: 4.0 times per week    Types: Other-see comments, Marijuana  Allergies   Other   Review of Systems Review of Systems  Constitutional: Positive for activity change.  Respiratory: Negative for shortness of breath.   Cardiovascular: Negative for chest pain.  Skin: Positive for rash and wound.  Allergic/Immunologic: Negative for immunocompromised state.  Neurological: Negative for numbness.  All other systems reviewed and are negative.    Physical Exam Updated Vital Signs BP (!) 142/75 (BP Location: Left Arm)   Pulse 68   Temp 99 F (37.2 C) (Oral)   Resp 15   SpO2 98%   Physical Exam Vitals signs and nursing note reviewed.  Constitutional:      Appearance: He is well-developed.  HENT:     Head: Normocephalic  and atraumatic.  Eyes:     Pupils: Pupils are equal, round, and reactive to light.  Neck:     Musculoskeletal: Neck supple.  Cardiovascular:     Rate and Rhythm: Normal rate and regular rhythm.     Heart sounds: Normal heart sounds. No murmur.  Pulmonary:     Effort: Pulmonary effort is normal. No respiratory distress.  Abdominal:     General: There is no distension.     Palpations: Abdomen is soft.     Tenderness: There is no abdominal tenderness. There is no guarding or rebound.  Musculoskeletal:        General: Swelling and tenderness present.     Comments: Left small digit is edematous.  There are couple of puncture wounds over the dorsum of the fifth digit, 1 by the nailbed and the other by the PIP.  There is a pustule by the nailbed. No drainage appreciated.  Patient's gross sensory exam appears intact. Small finger is flexed over the IP joint  Skin:    General: Skin is warm and dry.  Neurological:     Mental Status: He is alert and oriented to person, place, and time.      ED Treatments / Results  Labs (all labs ordered are listed, but only abnormal results are displayed) Labs Reviewed  CBC WITH DIFFERENTIAL/PLATELET - Abnormal; Notable for the following components:      Result Value   RBC 5.57 (*)    Hemoglobin 16.2 (*)    HCT 50.2 (*)    All other components within normal limits  AEROBIC CULTURE (SUPERFICIAL SPECIMEN)  BASIC METABOLIC PANEL    EKG None  Radiology Dg Hand Complete Left  Result Date: 02/05/2019 CLINICAL DATA:  Left hand dog bite. EXAM: LEFT HAND - COMPLETE 3+ VIEW COMPARISON:  None. FINDINGS: There is no evidence of fracture or dislocation. There is no evidence of arthropathy or other focal bone abnormality. Soft tissues are unremarkable. No radiopaque foreign body identified. IMPRESSION: Negative. Electronically Signed   By: Obie DredgeWilliam T Derry M.D.   On: 02/05/2019 14:53    Procedures Procedures (including critical care time)  Medications  Ordered in ED Medications  morphine 4 MG/ML injection 8 mg (has no administration in time range)  vancomycin (VANCOCIN) 1,750 mg in sodium chloride 0.9 % 500 mL IVPB (1,750 mg Intravenous New Bag/Given 02/05/19 1531)  lidocaine-EPINEPHrine (XYLOCAINE W/EPI) 2 %-1:200000 (PF) injection 10 mL (has no administration in time range)  HYDROmorphone (DILAUDID) injection 1 mg (1 mg Intravenous Given 02/05/19 1407)  Ampicillin-Sulbactam (UNASYN) 3 g in sodium chloride 0.9 % 100 mL IVPB (0 g Intravenous Stopped 02/05/19 1530)     Initial Impression / Assessment and Plan / ED Course  I have reviewed the triage vital signs and the nursing  notes.  Pertinent labs & imaging results that were available during my care of the patient were reviewed by me and considered in my medical decision making (see chart for details).        23 year old comes to the ER with chief complaint of hand infection. Patient had a dog bite 2 days ago.  On exam he does have a pustule, therefore there probably is some early signs of abscess.  He started antibiotics yesterday and has completed 3 doses.  Hand surgery called, as it appeared to [place] that patient was sent to the ER for evaluation by them.  On my exam patient appears to have cellulitis for sure, perhaps there is phlegmon versus early abscess formation.  We called hand surgery.  Initially they asked [place] to admit the patient to medicine and they will drain the patient, then they informed us that they would like the patient to be drained at the bedside in the ED and they would follow-up in the clinic.  Patient had received vancomycin and Unasyn in the ED. At discharge we will add Bactrim.  Dr. Regenia Skeeter will complete the procedure.  Final Clinical Impressions(s) / ED Diagnoses   Final diagnoses:  Tenosynovitis of finger  Cellulitis and abscess of hand    ED Discharge Orders         Ordered    doxycycline (VIBRAMYCIN) 100 MG capsule  2 times daily     02/05/19 1546            Varney Biles, MD 02/05/19 1554

## 2019-02-05 NOTE — Progress Notes (Signed)
A consult was received from an ED physician for Vancomycin per pharmacy dosing.  The patient's profile has been reviewed for ht/wt/allergies/indication/available labs.    A one time order has been placed for Vancomycin 1750mg  (MD had ordered 1gm , pt 88.6 kg).  Further antibiotics/pharmacy consults should be ordered by admitting physician if indicated.                       Thank you, Minda Ditto 02/05/2019  2:24 PM

## 2019-02-05 NOTE — Telephone Encounter (Signed)
Called [facility], Dr Nicola Police Surgeon looked at Dr Burnice Logan note from yesterday. Dr Amedeo Plenty recommended patient go to [facility] where his partner Dr Iran Planas is the Hand Dr on call. Called patient and mother and they are going to go to the ED today.

## 2019-02-05 NOTE — Consult Note (Addendum)
Reason for Consult:Dog bite Referring Physician: A Catherine MerryNanavati  Catherine Livingston is an 23 y.o. adult.  HPI: Catherine Livingston was bitten by his dog on Monday. He had increased pain yesterday and was seen and given some oral abx. When he was no better today he came to the ED. He denies fevers, chills, sweats, N/V. He is RHD and works as a Designer, fashion/clothingpizza delivery person.  Past Medical History:  Diagnosis Date  . ADHD (attention deficit hyperactivity disorder)    took meds for ADD in 7th-9th grade, stopped due to decrease appetitie  . Allergy   . Anxiety   . Depression   . Obesity     Past Surgical History:  Procedure Laterality Date  . ANKLE SURGERY      Family History  Problem Relation Age of Onset  . Depression Mother   . Depression Father   . Lung cancer Maternal Grandmother   . Depression Maternal Grandfather   . Prostate cancer Paternal Grandfather   . Stroke Paternal Grandfather     Social History:  reports that he has never smoked. He has never used smokeless tobacco. He reports current alcohol use. He reports current drug use. Frequency: 4.00 times per week. Drugs: Other-see comments and Marijuana.  Allergies:  Allergies  Allergen Reactions  . Other Other (See Comments)    Seasonal allergies causes runny nose    Medications: I have reviewed the patient's current medications.  Results for orders placed or performed during the hospital encounter of 02/05/19 (from the past 48 hour(s))  CBC with Differential     Status: Abnormal   Collection Time: 02/05/19  2:10 PM  Result Value Ref Range   WBC 5.6 4.0 - 10.5 K/uL   RBC 5.57 (H) 3.87 - 5.11 MIL/uL   Hemoglobin 16.2 (H) 12.0 - 15.0 g/dL   HCT 62.150.2 (H) 30.836.0 - 65.746.0 %   MCV 90.1 80.0 - 100.0 fL   MCH 29.1 26.0 - 34.0 pg   MCHC 32.3 30.0 - 36.0 g/dL   RDW 84.612.8 96.211.5 - 95.215.5 %   Platelets 257 150 - 400 K/uL   nRBC 0.0 0.0 - 0.2 %   Neutrophils Relative % 54 %   Neutro Abs 3.0 1.7 - 7.7 K/uL   Lymphocytes Relative 34 %   Lymphs Abs 1.9  0.7 - 4.0 K/uL   Monocytes Relative 11 %   Monocytes Absolute 0.6 0.1 - 1.0 K/uL   Eosinophils Relative 1 %   Eosinophils Absolute 0.1 0.0 - 0.5 K/uL   Basophils Relative 0 %   Basophils Absolute 0.0 0.0 - 0.1 K/uL   Immature Granulocytes 0 %   Abs Immature Granulocytes 0.02 0.00 - 0.07 K/uL    Comment: Performed at [facility], [address] [place], [place] [id number]  Basic metabolic panel     Status: None   Collection Time: 02/05/19  2:10 PM  Result Value Ref Range   Sodium 137 135 - 145 mmol/L   Potassium 4.4 3.5 - 5.1 mmol/L   Chloride 104 98 - 111 mmol/L   CO2 23 22 - 32 mmol/L   Glucose, Bld 91 70 - 99 mg/dL   BUN 13 6 - 20 mg/dL   Creatinine, Ser 4.400.84 0.44 - 1.00 mg/dL   Calcium 9.0 8.9 - 10.210.3 mg/dL   GFR calc non Af Amer >60 >60 mL/min   GFR calc Af Amer >60 >60 mL/min   Anion gap 10 5 - 15    Comment: Performed  at [facility], [place] [address] [place], [place] [address]    No results found.  Review of Systems  Constitutional: Negative for weight loss.  HENT: Negative for ear discharge, ear pain, hearing loss and tinnitus.   Eyes: Negative for blurred vision, double vision, photophobia and pain.  Respiratory: Negative for cough, sputum production and shortness of breath.   Cardiovascular: Negative for chest pain.  Gastrointestinal: Negative for abdominal pain, nausea and vomiting.  Genitourinary: Negative for dysuria, flank pain, frequency and urgency.  Musculoskeletal: Positive for joint pain (Left little finger/hand). Negative for back pain, falls, myalgias and neck pain.  Neurological: Negative for dizziness, tingling, sensory change, focal weakness, loss of consciousness and headaches.  Endo/Heme/Allergies: Does not bruise/bleed easily.  Psychiatric/Behavioral: Negative for depression, memory loss and substance abuse. The patient is not nervous/anxious.    Blood pressure (!) 142/75, pulse 68, temperature 99 F (37.2 C),  temperature source Oral, resp. rate 15, SpO2 98 %. Physical Exam  Constitutional: He appears well-developed and well-nourished. No distress.  HENT:  Head: Normocephalic and atraumatic.  Eyes: Conjunctivae are normal. Right eye exhibits no discharge. Left eye exhibits no discharge. No scleral icterus.  Neck: Normal range of motion.  Cardiovascular: Normal rate and regular rhythm.  Respiratory: Effort normal. No respiratory distress.  Musculoskeletal:     Comments: Left shoulder, elbow, wrist, digits- Healing puncture wounds dorsum of little finger/hand, severe TTP and pain with AROM/PROM PIP joint, no instability, no blocks to motion  Sens  Ax/R/M/U intact  Mot   Ax/ R/ PIN/ M/ AIN/ U intact  Rad 2+  Neurological: He is alert.  Skin: Skin is warm and dry. He is not diaphoretic.  Psychiatric: He has a normal mood and affect. His behavior is normal.    Assessment/Plan: Dog bite -- Plan bedside I&D with wounds left open by EDP/EDPA, splint for comfort, f/u with Dr. Caralyn Guile as OP.    Lisette Abu, PA-C Orthopedic Surgery [phone] 02/05/2019, 2:50 PM

## 2019-02-05 NOTE — ED Triage Notes (Signed)
Pt reports dog bite to hand. Pt reports his dog bit him x2 days ago. Puss noted to area. MD at bedside

## 2019-02-05 NOTE — ED Notes (Signed)
No reaction to medication noted alert and oriented x 3 no respiratory or acute distress noted. 

## 2019-02-06 NOTE — Telephone Encounter (Signed)
Appreciate their care and support.

## 2019-02-07 DIAGNOSIS — S61257A Open bite of left little finger without damage to nail, initial encounter: Secondary | ICD-10-CM | POA: Diagnosis not present

## 2019-02-14 DIAGNOSIS — S61257A Open bite of left little finger without damage to nail, initial encounter: Secondary | ICD-10-CM | POA: Diagnosis not present

## 2019-04-14 ENCOUNTER — Other Ambulatory Visit: Payer: Self-pay | Admitting: Family Medicine

## 2019-04-14 NOTE — Telephone Encounter (Signed)
Requested medication (s) are due for refill today: yes  Requested medication (s) are on the active medication list: yes  Last refill:  02/20/2019  Future visit scheduled: no  Notes to clinic:  Review for refill   Requested Prescriptions  Pending Prescriptions Disp Refills   testosterone cypionate (DEPOTESTOSTERONE CYPIONATE) 200 MG/ML injection [Pharmacy Med Name: TESTOSTERONE CYP 200 MG/ML] 2 mL 1    Sig: INJECT 0.4 MLS INTO THE MUSCLE EVERY 14 (FOURTEEN) DAYS.     There is no refill protocol information for this order

## 2019-05-20 ENCOUNTER — Other Ambulatory Visit: Payer: Self-pay | Admitting: Family Medicine

## 2019-05-28 ENCOUNTER — Ambulatory Visit
Admission: RE | Admit: 2019-05-28 | Discharge: 2019-05-28 | Disposition: A | Discharge status: Home / Self Care | Payer: BC Managed Care – PPO | Source: Ambulatory Visit | Attending: Family Medicine | Admitting: Family Medicine

## 2019-05-28 ENCOUNTER — Other Ambulatory Visit: Payer: BC Managed Care – PPO

## 2019-05-28 DIAGNOSIS — R7989 Other specified abnormal findings of blood chemistry: Secondary | ICD-10-CM

## 2019-05-28 DIAGNOSIS — E281 Androgen excess: Secondary | ICD-10-CM

## 2019-05-28 DIAGNOSIS — N8301 Follicular cyst of right ovary: Secondary | ICD-10-CM | POA: Diagnosis not present

## 2019-05-28 DIAGNOSIS — E348 Other specified endocrine disorders: Secondary | ICD-10-CM

## 2019-05-28 DIAGNOSIS — E278 Other specified disorders of adrenal gland: Secondary | ICD-10-CM

## 2019-05-28 DIAGNOSIS — N8302 Follicular cyst of left ovary: Secondary | ICD-10-CM | POA: Diagnosis not present

## 2019-07-03 ENCOUNTER — Other Ambulatory Visit: Payer: Self-pay

## 2019-07-03 ENCOUNTER — Ambulatory Visit (INDEPENDENT_AMBULATORY_CARE_PROVIDER_SITE_OTHER): Payer: BC Managed Care – PPO | Admitting: Internal Medicine

## 2019-07-03 ENCOUNTER — Encounter: Payer: Self-pay | Admitting: Internal Medicine

## 2019-07-03 VITALS — BP 124/84 | HR 53 | Temp 98.1°F | Wt 207.0 lb

## 2019-07-03 DIAGNOSIS — Z789 Other specified health status: Secondary | ICD-10-CM

## 2019-07-03 DIAGNOSIS — R1031 Right lower quadrant pain: Secondary | ICD-10-CM

## 2019-07-03 DIAGNOSIS — R1032 Left lower quadrant pain: Secondary | ICD-10-CM | POA: Diagnosis not present

## 2019-07-03 DIAGNOSIS — F640 Transsexualism: Secondary | ICD-10-CM | POA: Diagnosis not present

## 2019-07-03 DIAGNOSIS — E282 Polycystic ovarian syndrome: Secondary | ICD-10-CM

## 2019-07-03 NOTE — Patient Instructions (Signed)
Polycystic Ovarian Syndrome  Polycystic ovarian syndrome (PCOS) is a common hormonal disorder among women of reproductive age. In most women with PCOS, many small fluid-filled sacs (cysts) grow on the ovaries, and the cysts are not part of a normal menstrual cycle. PCOS can cause problems with your menstrual periods and make it difficult to get pregnant. It can also cause an increased risk of miscarriage with pregnancy. If it is not treated, PCOS can lead to serious health problems, such as diabetes and heart disease. What are the causes? The cause of PCOS is not known, but it may be the result of a combination of certain factors, such as:  Irregular menstrual cycle.  High levels of certain hormones (androgens).  Problems with the hormone that helps to control blood sugar (insulin resistance).  Certain genes. What increases the risk? This condition is more likely to develop in women who have a family history of PCOS. What are the signs or symptoms? Symptoms of PCOS may include:  Multiple ovarian cysts.  Infrequent periods or no periods.  Periods that are too frequent or too heavy.  Unpredictable periods.  Inability to get pregnant (infertility) because of not ovulating.  Increased growth of hair on the face, chest, stomach, back, thumbs, thighs, or toes.  Acne or oily skin. Acne may develop during adulthood, and it may not respond to treatment.  Pelvic pain.  Weight gain or obesity.  Patches of thickened and dark brown or black skin on the neck, arms, breasts, or thighs (acanthosis nigricans).  Excess hair growth on the face, chest, abdomen, or upper thighs (hirsutism). How is this diagnosed? This condition is diagnosed based on:  Your medical history.  A physical exam, including a pelvic exam. Your health care provider may look for areas of increased hair growth on your skin.  Tests, such as: ? Ultrasound. This may be used to examine the ovaries and the lining of the  uterus (endometrium) for cysts. ? Blood tests. These may be used to check levels of sugar (glucose), female hormone (testosterone), and female hormones (estrogen and progesterone) in your blood. How is this treated? There is no cure for PCOS, but treatment can help to manage symptoms and prevent more health problems from developing. Treatment varies depending on:  Your symptoms.  Whether you want to have a baby or whether you need birth control (contraception). Treatment may include nutrition and lifestyle changes along with:  Progesterone hormone to start a menstrual period.  Birth control pills to help you have regular menstrual periods.  Medicines to make you ovulate, if you want to get pregnant.  Medicine to reduce excessive hair growth.  Surgery, in severe cases. This may involve making small holes in one or both of your ovaries. This decreases the amount of testosterone that your body produces. Follow these instructions at home:  Take over-the-counter and prescription medicines only as told by your health care provider.  Follow a healthy meal plan. This can help you reduce the effects of PCOS. ? Eat a healthy diet that includes lean proteins, complex carbohydrates, fresh fruits and vegetables, low-fat dairy products, and healthy fats. Make sure to eat enough fiber.  If you are overweight, lose weight as told by your health care provider. ? Losing 10% of your body weight may improve symptoms. ? Your health care provider can determine how much weight loss is best for you and can help you lose weight safely.  Keep all follow-up visits as told by your health care provider.   This is important. Contact a health care provider if:  Your symptoms do not get better with medicine.  You develop new symptoms. This information is not intended to replace advice given to you by your health care provider. Make sure you discuss any questions you have with your health care provider. Document  Released: 10/27/2004 Document Revised: 06/15/2017 Document Reviewed: 12/19/2015 Elsevier Patient Education  2020 Elsevier Inc[employer]  

## 2019-07-03 NOTE — Progress Notes (Signed)
Subjective:    Patient ID: Catherine Livingston, adult    DOB: 09/15/1995, 23 y.o.   MRN: [id number]  HPI  Pt presents to the clinic today with c/o bilateral lower abdominal pain. This started about 2 months ago. He describes the pain as cramping, aching. The pain does not radiate. He denies nausea, vomiting, reflux, constipation, diarrhea or blood in the stool. He (borm female) is having monthly periods that are becoming heavier with more cramping. He saw Dr. Brigitte Pulse for the same. A pelvic/transvaginal ultrasound was ordered which showed multiple follicles of bilateral ovaries concerning for PCOS. He reports no on ever followed up on this and he continues to have pain. He is currently undergoing testosterone therapy for transition, plans to also have a mastectomy and hysterectomy.  Review of Systems  Past Medical History:  Diagnosis Date  . ADHD (attention deficit hyperactivity disorder)    took meds for ADD in 7th-9th grade, stopped due to decrease appetitie  . Allergy   . Anxiety   . Depression   . Obesity     Current Outpatient Medications  Medication Sig Dispense Refill  . amoxicillin-clavulanate (AUGMENTIN) 875-125 MG tablet Take 1 tablet by mouth 2 (two) times daily. 20 tablet 0  . betamethasone dipropionate 0.05 % lotion Apply directly to scalp at the well-defined areas of hair loss only and 1 cm beyond once daily 60 mL 2  . metFORMIN (GLUCOPHAGE-XR) 500 MG 24 hr tablet TAKE 1 TABLET BY MOUTH EVERY DAY IN THE EVENING WITH FOOD    . NEEDLE, DISP, 18 G 18G X 1-1/2" MISC 1 Units by Does not apply route once a week. 50 each 0  . Syringe/Needle, Disp, (SYRINGE 3CC/22GX1-1/2") 22G X 1-1/2" 3 ML MISC 1 Units by Does not apply route once a week. 50 each 0  . testosterone cypionate (DEPOTESTOSTERONE CYPIONATE) 200 MG/ML injection Inject 0.4 mLs (80 mg total) into the muscle every 14 (fourteen) days. 3 mL 0   No current facility-administered medications for this visit.    Allergies    Allergen Reactions  . Other Other (See Comments)    Seasonal allergies causes runny nose    Family History  Problem Relation Age of Onset  . Depression Mother   . Depression Father   . Lung cancer Maternal Grandmother   . Depression Maternal Grandfather   . Prostate cancer Paternal Grandfather   . Stroke Paternal Grandfather     Social History   Socioeconomic History  . Marital status: Single    Spouse name: Not on file  . Number of children: Not on file  . Years of education: Not on file  . Highest education level: Not on file  Occupational History  . Not on file  Tobacco Use  . Smoking status: Never Smoker  . Smokeless tobacco: Never Used  Substance and Sexual Activity  . Alcohol use: Yes    Alcohol/week: 0.0 standard drinks    Comment: occasional  . Drug use: Yes    Frequency: 4.0 times per week    Types: Other-see comments, Marijuana  . Sexual activity: Never  Other Topics Concern  . Not on file  Social History Narrative  . Not on file   Social Determinants of Health   Financial Resource Strain:   . Difficulty of Paying Living Expenses: Not on file  Food Insecurity:   . Worried About Charity fundraiser in the Last Year: Not on file  . Ran Out of Food in the Last  Year: Not on file  Transportation Needs:   . Lack of Transportation (Medical): Not on file  . Lack of Transportation (Non-Medical): Not on file  Physical Activity:   . Days of Exercise per Week: Not on file  . Minutes of Exercise per Session: Not on file  Stress:   . Feeling of Stress : Not on file  Social Connections:   . Frequency of Communication with Friends and Family: Not on file  . Frequency of Social Gatherings with Friends and Family: Not on file  . Attends Religious Services: Not on file  . Active Member of Clubs or Organizations: Not on file  . Attends BankerClub or Organization Meetings: Not on file  . Marital Status: Not on file  Intimate Partner Violence:   . Fear of Current or  Ex-Partner: Not on file  . Emotionally Abused: Not on file  . Physically Abused: Not on file  . Sexually Abused: Not on file     Constitutional: Denies fever, malaise, fatigue, headache or abrupt weight changes.  Respiratory: Denies difficulty breathing, shortness of breath, cough or sputum production.   Cardiovascular: Denies chest pain, chest tightness, palpitations or swelling in the hands or feet.  Gastrointestinal: Pt reports abdominal pain. Denies  bloating, constipation, diarrhea or blood in the stool.  GU: Pt reports heavy menstrual cramps. Denies urgency, frequency, pain with urination, burning sensation, blood in urine, odor or discharge.  No other specific complaints in a complete review of systems (except as listed in HPI above).     Objective:   Physical Exam  BP 124/84   Pulse (!) 53   Temp 98.1 F (36.7 C) (Temporal)   Wt 207 lb (93.9 kg)   LMP 06/22/2019   SpO2 98%   BMI 32.91 kg/m   Wt Readings from Last 3 Encounters:  02/04/19 195 lb 4 oz (88.6 kg)  03/15/18 216 lb (98 kg)  08/27/17 214 lb 8 oz (97.3 kg)    General: Appears his stated age, obese, in NAD. Cardiovascular: Normal rate and rhythm. S1,S2 noted.  No murmur, rubs or gallops noted.  Pulmonary/Chest: Normal effort and positive vesicular breath sounds. No respiratory distress. No wheezes, rales or ronchi noted.  Abdomen: Soft and mildly tender in the LLQ. Normal bowel sounds. No distention or masses noted.  Neurological: Alert and oriented.    BMET    Component Value Date/Time   NA 137 02/05/2019 1410   NA 141 09/10/2018 1603   K 4.4 02/05/2019 1410   CL 104 02/05/2019 1410   CO2 23 02/05/2019 1410   GLUCOSE 91 02/05/2019 1410   BUN 13 02/05/2019 1410   BUN 13 09/10/2018 1603   CREATININE 0.84 02/05/2019 1410   CALCIUM 9.0 02/05/2019 1410   GFRNONAA >60 02/05/2019 1410   GFRAA >60 02/05/2019 1410    Lipid Panel     Component Value Date/Time   CHOL 157 08/27/2017 1546   TRIG 116.0  08/27/2017 1546   HDL 36.40 (L) 08/27/2017 1546   CHOLHDL 4 08/27/2017 1546   VLDL 23.2 08/27/2017 1546   LDLCALC 97 08/27/2017 1546    CBC    Component Value Date/Time   WBC 5.6 02/05/2019 1410   RBC 5.57 (H) 02/05/2019 1410   HGB 16.2 (H) 02/05/2019 1410   HGB 16.9 (H) 09/10/2018 1603   HCT 50.2 (H) 02/05/2019 1410   HCT 49.4 (H) 09/10/2018 1603   PLT 257 02/05/2019 1410   PLT 304 09/10/2018 1603   MCV 90.1 02/05/2019  1410   MCV 87 09/10/2018 1603   MCH 29.1 02/05/2019 1410   MCHC 32.3 02/05/2019 1410   RDW 12.8 02/05/2019 1410   RDW 12.4 09/10/2018 1603   LYMPHSABS 1.9 02/05/2019 1410   LYMPHSABS 2.5 05/29/2018 1215   MONOABS 0.6 02/05/2019 1410   EOSABS 0.1 02/05/2019 1410   EOSABS 0.2 05/29/2018 1215   BASOSABS 0.0 02/05/2019 1410   BASOSABS 0.0 05/29/2018 1215    Hgb A1C Lab Results  Component Value Date   HGBA1C 5.6 05/29/2018           Assessment & Plan:  Bilateral Lower Abdominal Pain, PCOS:  Will refer to GYN for further evaluation and treatment  Return precautions discussed  Nicki Reaper, NP This visit occurred during the SARS-CoV-2 public health emergency.  Safety protocols were in place, including screening questions prior to the visit, additional usage of staff PPE, and extensive cleaning of exam room while observing appropriate contact time as indicated for disinfecting solutions.

## 2019-07-08 ENCOUNTER — Encounter: Payer: Self-pay | Admitting: Internal Medicine

## 2019-08-12 ENCOUNTER — Encounter: Payer: BC Managed Care – PPO | Admitting: Internal Medicine

## 2021-05-19 IMAGING — US US PELVIS COMPLETE WITH TRANSVAGINAL
1 series · 13 of 25 positions shown · non-contrast
Comparison: None

CLINICAL DATA: Evaluate for possible PCOS.

EXAM:
TRANSABDOMINAL AND TRANSVAGINAL ULTRASOUND OF PELVIS
TECHNIQUE: Both transabdominal and transvaginal ultrasound examinations of the
pelvis were performed. Transabdominal technique was performed for
global imaging of the pelvis including uterus, ovaries, adnexal
regions, and pelvic cul-de-sac. It was necessary to proceed with
endovaginal exam following the transabdominal exam to visualize the
endometrium and ovaries.

[Series 1: us pelvis complete with transvaginal · 0.25mm/px · 56 acquisitions, 13 frames shown]
[im 1/56]
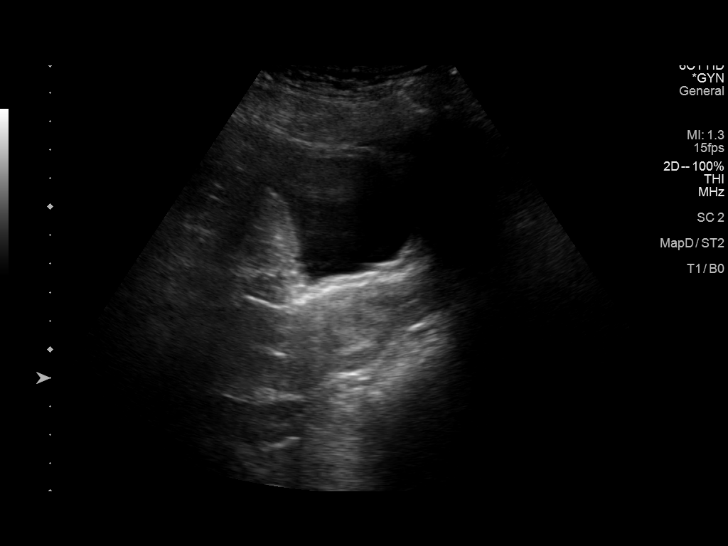
[im 5/56]
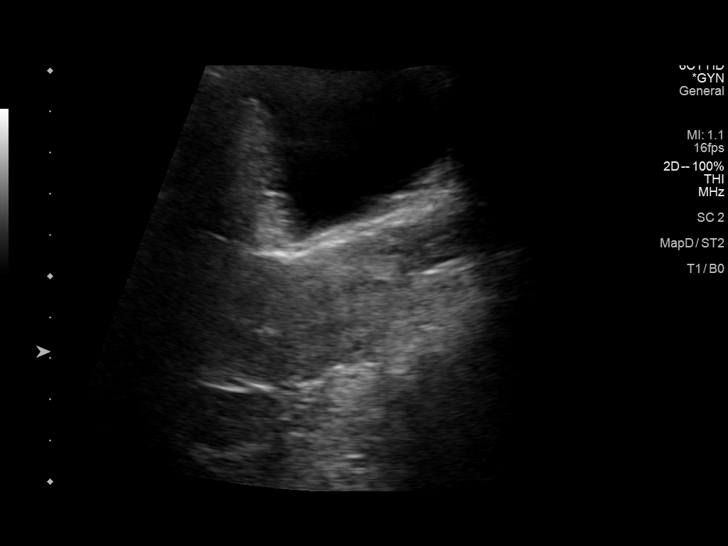
[im 10/56]
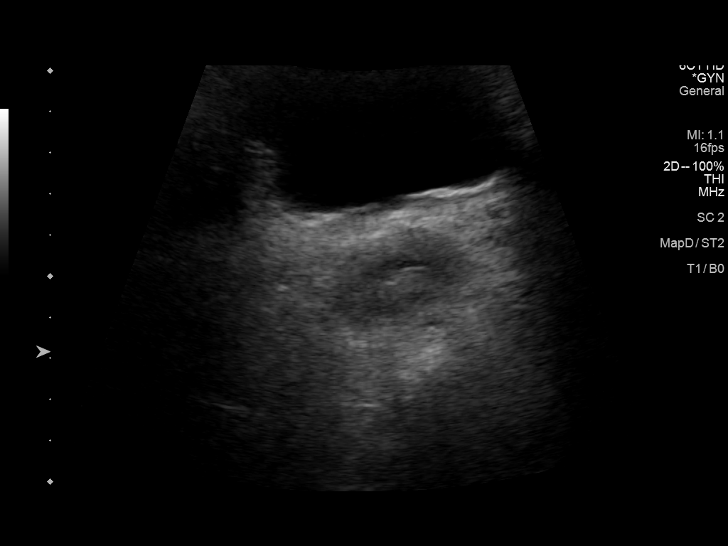
[im 14/56]
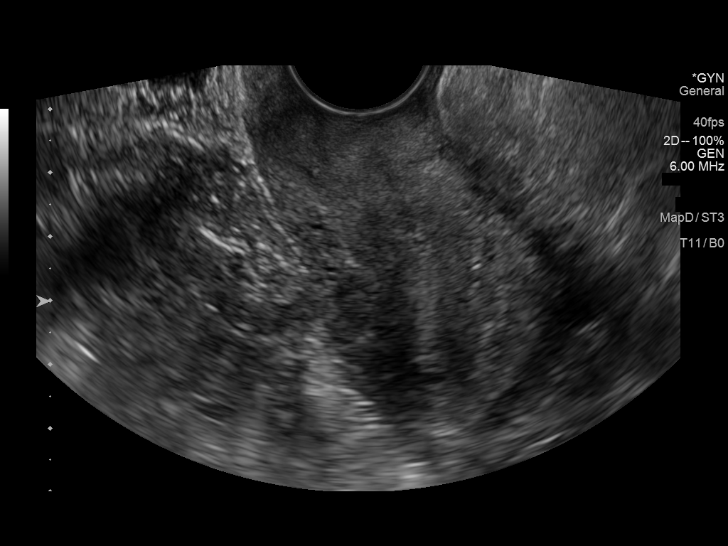
[im 19/56]
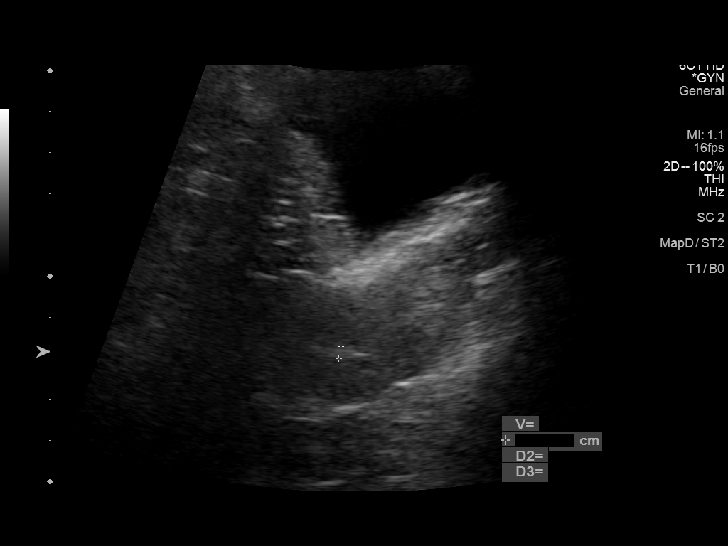
[im 23/56]
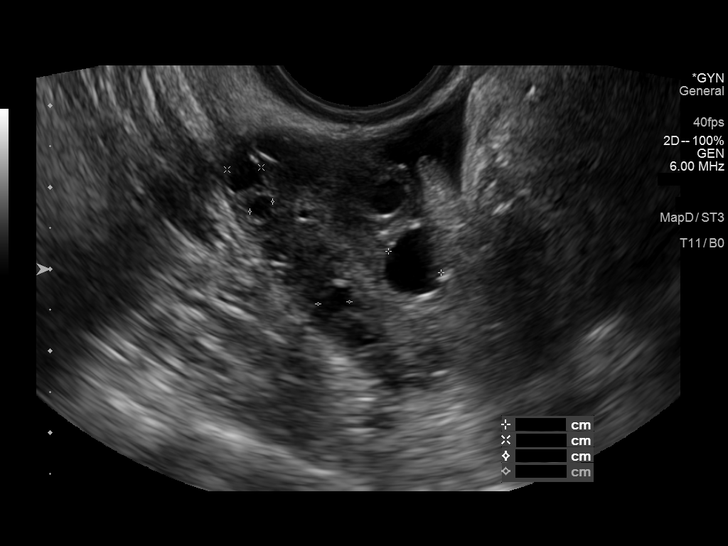
[im 28/56]
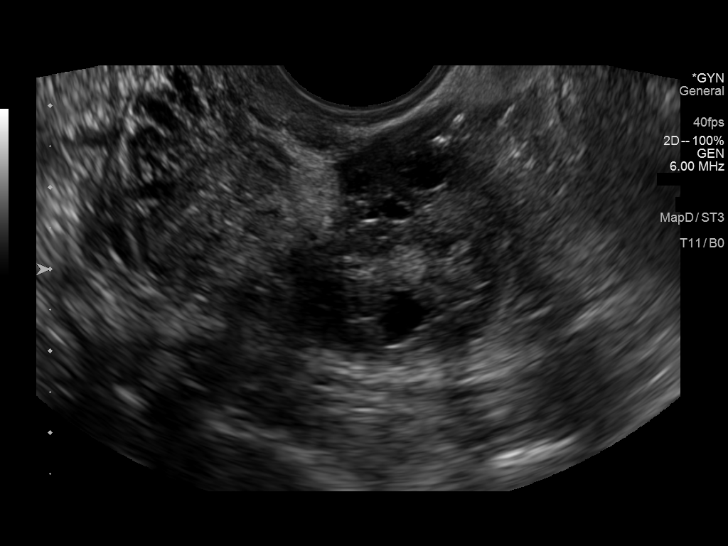
[im 33/56]
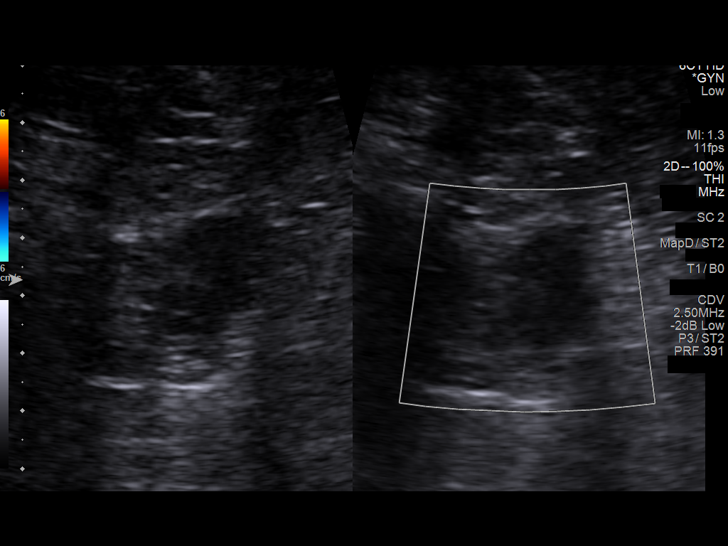
[im 37/56]
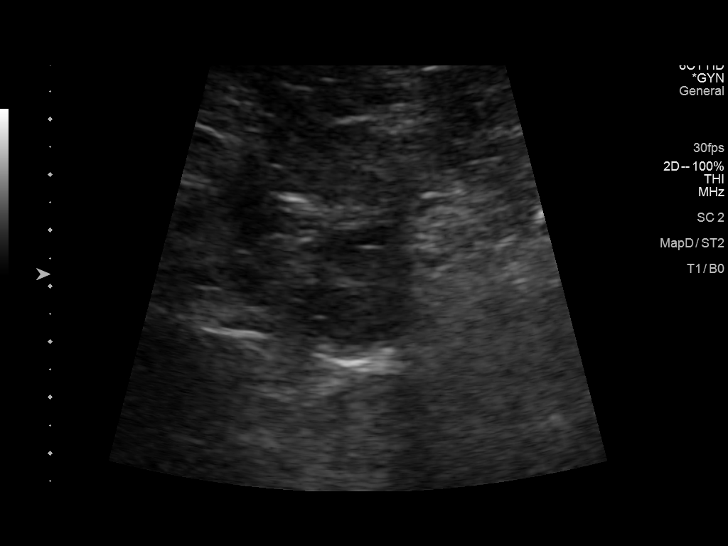
[im 42/56]
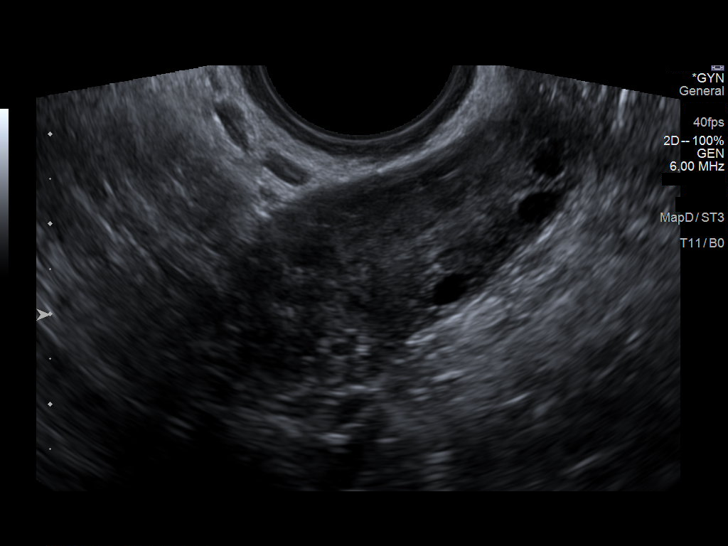
[im 46/56]
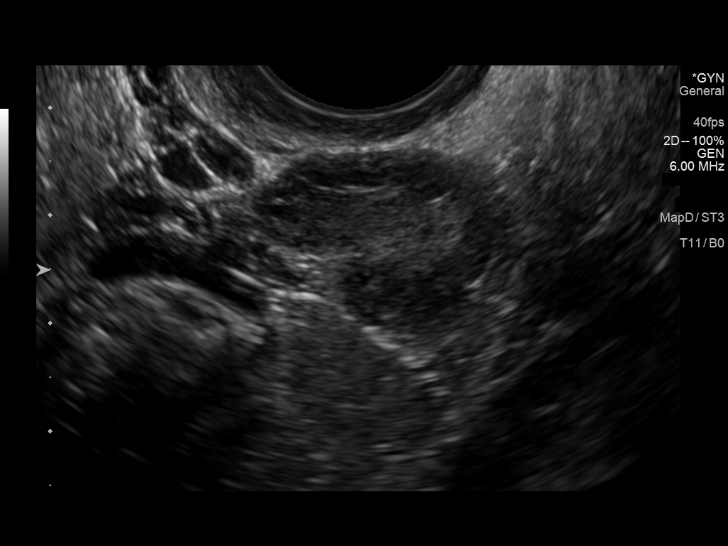
[im 51/56]
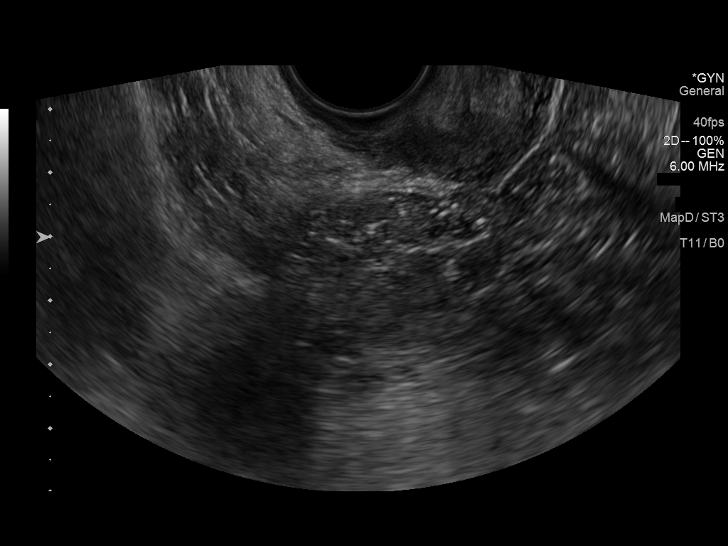
[im 56/56]
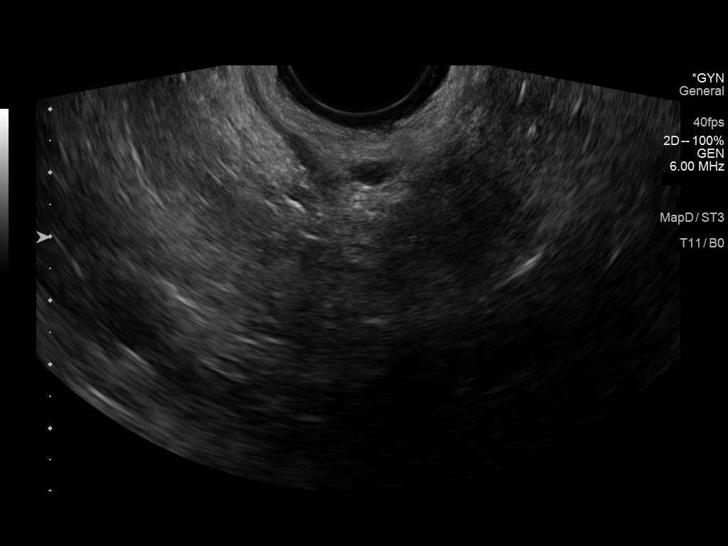

[13 of 25 positions shown; findings below may reference images not displayed]

FINDINGS: Uterus

Measurements: 6.9 x 3.0 x 2.8 cm = volume: 30 mL. No fibroids or
other mass visualized.

Endometrium

Thickness: 6 mm.  No focal abnormality visualized.

Right ovary

Measurements: 4.4 x 2.1 x 2.7 cm = volume: 13 mL. Contains numerous
follicles, particularly along the periphery of the ovary. At least 6
follicles are seen on the single image.

Left ovary

Measurements: 2.2 x 5.2 x 2.1 cm = volume: 12 mL. Contains numerous
small peripheral follicles.

Other findings

No abnormal free fluid.
IMPRESSION: 1. Numerous small follicles in the periphery of both ovaries, right
greater than left may be seen in the setting of PCOS in the
appropriate clinical scenario.
2. No other abnormalities.

## 2021-07-11 ENCOUNTER — Ambulatory Visit: Admit: 2021-07-11 | Discharge status: Absent without leave | Payer: Self-pay

## 2022-05-08 DIAGNOSIS — S0185XA Open bite of other part of head, initial encounter: Secondary | ICD-10-CM | POA: Diagnosis not present

## 2022-05-08 DIAGNOSIS — W5501XA Bitten by cat, initial encounter: Secondary | ICD-10-CM | POA: Diagnosis not present

## 2022-05-08 DIAGNOSIS — S61451A Open bite of right hand, initial encounter: Secondary | ICD-10-CM | POA: Diagnosis not present

## 2022-10-03 DIAGNOSIS — F419 Anxiety disorder, unspecified: Secondary | ICD-10-CM | POA: Diagnosis not present

## 2022-10-03 DIAGNOSIS — K219 Gastro-esophageal reflux disease without esophagitis: Secondary | ICD-10-CM | POA: Diagnosis not present

## 2022-10-03 DIAGNOSIS — F1290 Cannabis use, unspecified, uncomplicated: Secondary | ICD-10-CM | POA: Diagnosis not present

## 2022-10-03 DIAGNOSIS — K760 Fatty (change of) liver, not elsewhere classified: Secondary | ICD-10-CM | POA: Diagnosis not present

## 2022-10-03 DIAGNOSIS — R079 Chest pain, unspecified: Secondary | ICD-10-CM | POA: Diagnosis not present

## 2022-10-03 DIAGNOSIS — R112 Nausea with vomiting, unspecified: Secondary | ICD-10-CM | POA: Diagnosis not present

## 2022-10-03 DIAGNOSIS — R109 Unspecified abdominal pain: Secondary | ICD-10-CM | POA: Diagnosis not present

## 2022-10-03 DIAGNOSIS — R1115 Cyclical vomiting syndrome unrelated to migraine: Secondary | ICD-10-CM | POA: Diagnosis not present

## 2022-10-03 DIAGNOSIS — R0600 Dyspnea, unspecified: Secondary | ICD-10-CM | POA: Diagnosis not present

## 2022-10-03 DIAGNOSIS — N3289 Other specified disorders of bladder: Secondary | ICD-10-CM | POA: Diagnosis not present

## 2022-11-03 DIAGNOSIS — Z79899 Other long term (current) drug therapy: Secondary | ICD-10-CM | POA: Diagnosis not present

## 2022-11-03 DIAGNOSIS — R7989 Other specified abnormal findings of blood chemistry: Secondary | ICD-10-CM | POA: Diagnosis not present

## 2022-11-03 DIAGNOSIS — Z1389 Encounter for screening for other disorder: Secondary | ICD-10-CM | POA: Diagnosis not present

## 2022-11-03 DIAGNOSIS — F640 Transsexualism: Secondary | ICD-10-CM | POA: Diagnosis not present

## 2022-11-09 DIAGNOSIS — Z131 Encounter for screening for diabetes mellitus: Secondary | ICD-10-CM | POA: Diagnosis not present

## 2022-11-09 DIAGNOSIS — Z124 Encounter for screening for malignant neoplasm of cervix: Secondary | ICD-10-CM | POA: Diagnosis not present

## 2022-11-09 DIAGNOSIS — Z136 Encounter for screening for cardiovascular disorders: Secondary | ICD-10-CM | POA: Diagnosis not present

## 2022-11-09 DIAGNOSIS — Z0000 Encounter for general adult medical examination without abnormal findings: Secondary | ICD-10-CM | POA: Diagnosis not present

## 2022-11-09 DIAGNOSIS — Z113 Encounter for screening for infections with a predominantly sexual mode of transmission: Secondary | ICD-10-CM | POA: Diagnosis not present

## 2022-11-09 DIAGNOSIS — Z1151 Encounter for screening for human papillomavirus (HPV): Secondary | ICD-10-CM | POA: Diagnosis not present

## 2022-11-09 DIAGNOSIS — Z114 Encounter for screening for human immunodeficiency virus [HIV]: Secondary | ICD-10-CM | POA: Diagnosis not present

## 2022-11-09 DIAGNOSIS — Z13228 Encounter for screening for other metabolic disorders: Secondary | ICD-10-CM | POA: Diagnosis not present

## 2022-11-09 DIAGNOSIS — Z1159 Encounter for screening for other viral diseases: Secondary | ICD-10-CM | POA: Diagnosis not present

## 2022-11-09 DIAGNOSIS — F321 Major depressive disorder, single episode, moderate: Secondary | ICD-10-CM | POA: Diagnosis not present

## 2023-05-21 DIAGNOSIS — Z136 Encounter for screening for cardiovascular disorders: Secondary | ICD-10-CM | POA: Diagnosis not present

## 2023-05-21 DIAGNOSIS — F640 Transsexualism: Secondary | ICD-10-CM | POA: Diagnosis not present

## 2023-05-21 DIAGNOSIS — E349 Endocrine disorder, unspecified: Secondary | ICD-10-CM | POA: Diagnosis not present

## 2023-08-13 DIAGNOSIS — E349 Endocrine disorder, unspecified: Secondary | ICD-10-CM | POA: Diagnosis not present

## 2023-08-13 DIAGNOSIS — Z13228 Encounter for screening for other metabolic disorders: Secondary | ICD-10-CM | POA: Diagnosis not present

## 2024-02-01 ENCOUNTER — Encounter: Payer: Self-pay | Admitting: Advanced Practice Midwife
